# Patient Record
Sex: Male | Born: 1960 | Race: Black or African American | Hispanic: No | Marital: Single | Smoking: Never smoker
Health system: Southern US, Community
[De-identification: ages and names within clinical notes are randomized; demographics above are authoritative.]

## PROBLEM LIST (undated history)

## (undated) DIAGNOSIS — F32A Depression, unspecified: Secondary | ICD-10-CM

## (undated) DIAGNOSIS — F419 Anxiety disorder, unspecified: Secondary | ICD-10-CM

## (undated) HISTORY — PX: KNEE SURGERY: SHX244

## (undated) HISTORY — PX: KNEE ARTHROSCOPY: SUR90

## (undated) HISTORY — PX: TONSILLECTOMY: SUR1361

## (undated) HISTORY — DX: Depression, unspecified: F32.A

---

## 1997-12-11 DIAGNOSIS — G4730 Sleep apnea, unspecified: Secondary | ICD-10-CM

## 2004-10-31 DIAGNOSIS — F1120 Opioid dependence, uncomplicated: Secondary | ICD-10-CM

## 2014-09-24 ENCOUNTER — Ambulatory Visit: Payer: BC Managed Care – PPO | Admitting: Family Medicine

## 2014-09-24 ENCOUNTER — Encounter
Admission: RE | Disposition: A | Discharge status: Home / Self Care | Payer: Self-pay | Source: Ambulatory Visit | Attending: Family Medicine

## 2014-09-24 ENCOUNTER — Ambulatory Visit
Admission: RE | Admit: 2014-09-24 | Discharge: 2014-09-24 | Disposition: A | Discharge status: Home / Self Care | Payer: BC Managed Care – PPO | Source: Ambulatory Visit | Attending: Family Medicine | Admitting: Family Medicine

## 2014-09-24 DIAGNOSIS — K648 Other hemorrhoids: Secondary | ICD-10-CM | POA: Insufficient documentation

## 2014-09-24 DIAGNOSIS — Z1211 Encounter for screening for malignant neoplasm of colon: Secondary | ICD-10-CM | POA: Insufficient documentation

## 2014-09-24 DIAGNOSIS — K644 Residual hemorrhoidal skin tags: Secondary | ICD-10-CM | POA: Insufficient documentation

## 2014-09-24 HISTORY — DX: Depression, unspecified: F32.A

## 2014-09-24 HISTORY — PX: COLONOSCOPY: SHX174

## 2014-09-24 SURGERY — DONT USE, USE 1094-COLONOSCOPY, DIAGNOSTIC (SCREENING)
Anesthesia: Conscious Sedation | Site: Anus | Wound class: Clean Contaminated

## 2014-09-24 MED ORDER — MIDAZOLAM HCL 2 MG/2ML IJ SOLN
INTRAMUSCULAR | Status: AC
Start: 2014-09-24 — End: ?
  Filled 2014-09-24: qty 2

## 2014-09-24 MED ORDER — MIDAZOLAM HCL 2 MG/2ML IJ SOLN
INTRAMUSCULAR | Status: AC
Start: 2014-09-24 — End: ?
  Filled 2014-09-24: qty 4

## 2014-09-24 MED ORDER — VH FENTANYL CITRATE 100 MCG/2 ML (NARRATOR)
INTRAMUSCULAR | Status: DC | PRN
Start: 2014-09-24 — End: 2014-09-24
  Administered 2014-09-24: 100 ug via INTRAVENOUS
  Administered 2014-09-24: 50 ug via INTRAVENOUS

## 2014-09-24 MED ORDER — FENTANYL CITRATE 0.05 MG/ML IJ SOLN
INTRAMUSCULAR | Status: AC
Start: 2014-09-24 — End: ?
  Filled 2014-09-24: qty 2

## 2014-09-24 MED ORDER — MIDAZOLAM HCL 2 MG/2ML IJ SOLN
INTRAMUSCULAR | Status: DC | PRN
Start: 2014-09-24 — End: 2014-09-24
  Administered 2014-09-24: 2 mg via INTRAVENOUS
  Administered 2014-09-24: 4 mg via INTRAVENOUS
  Administered 2014-09-24: 2 mg via INTRAVENOUS

## 2014-09-24 MED ORDER — LACTATED RINGERS IV SOLN
INTRAVENOUS | Status: DC
Start: 2014-09-24 — End: 2014-09-24

## 2014-09-24 SURGICAL SUPPLY — 8 items
BASKET RETRIEVAL ROTH NET STD (Supply) IMPLANT
FORCEP BIOPSY DISP. (Supply) IMPLANT
FORCEP BIOPSY HOT ALLIGATOR  D (Supply) IMPLANT
MARKER ENDOSCOPIC SPOT (Supply) IMPLANT
NEEDLE INJECTOR FORCE MAX (Supply) IMPLANT
QUICK CLIP II SGL USE ROTATABL (Supply) IMPLANT
SNARE WIRE DISP 25MM LOOP (Supply) IMPLANT
TRAP STERILE SPECIMEN 40CC (Supply) IMPLANT

## 2014-09-24 NOTE — Discharge Instructions (Signed)
[facility]  Colonoscopy Discharge Instructions    1.  Since you have had sedation, for the next 24 hours it is best to:   A.  Have someone stay with you for the next 3-4 hours.   B.  Not drink alcoholic beverages.   C.  Not make important personal or business decisions.   D.  Not drive a vehicle or operate hazardous machinery.    2.  You may experience the following:   A.  Bloating or lack of appetite.   B.  Excessive gas   C.  Some mild rectal bleeding.   D.  Constipation or diarrhea.    3.  You may limit the amount of discomfort you have by:   A.  Limiting your intake to just what you feel like eating, not what you          think you should eat.   B.  Drink at least 3 glasses of water or other liquid (not alcohol) a day         more than you usually drink.   C.  Take 1 heaping teaspoon of Metamucil or other fiber supplement every         day until stools are consistent.    4.  Please call Dr. Felisa Bonier if you observe any of the following:   A.  Excessive bleeding from the rectum.  More than 1 piece of toilet paper        or in 2 consecutive stools.   B.  Pain in the stomach that is getting worse instead of better over a        2-3 hour period.   C.  Nausea or vomiting.   D.  Increasing tenderness of the abdominal area throughout the day.   E.  Other unusual symptoms.     Dr. Felisa Bonier will send you a letter.    IF YOU THINK YOU HAVE AN EMERGENCY, PLEASE GO TO THE NEAREST EMERGENCY DEPARTMENT OR CALL 911.

## 2014-09-24 NOTE — H&P (Signed)
S)  Mr. Gill is a 53 yo male presenting today for screening colonoscopy.  No previous endo hx, no family hx of colon pathology.    O) VSS  NDNT  CTAB  RRR    A)  53 yo male for screening colonoscopy.    P)  Proceed with routine precautions.  H+P reviewed and no changes noted.

## 2014-09-24 NOTE — Op Note (Signed)
Date: 09/24/2014  PROCEDURE: Colonoscopy.    FACILITY: [facility].    PATIENT ID: The patient is a 53 year old male.  Date of birth  September 20, 1961.    COMORBIDITIES/ASA CLASSIFICATION: Depression, hypothyroidism,  arthritis, ED.  ASA classification two.    PRIOR ENDOSCOPIC HISTORY: None.    INDICATION: Colon cancer screening.  No family history of colon  cancer or colon pathology.    PROCEDURE: Indications, risks, and benefits were discussed with  the patient and all questions were answered.  Written consent  was obtained and visualized on the chart prior to beginning the  procedure.  No changes were noted.    PHYSICIAN: Olena Leatherwood, MD.    DESCRIPTION: Conscious sedation was administered initially with  4 mg Versed and pain management with 100 mcg fentanyl.  Digital  rectal exam was performed.  The sphincter tone was normal, no  masses were palpated, minimal external hemorrhoids were  visualized.  The Olympus EVIS EXERA II, serial [id number] was then  inserted into the rectum and advanced carefully to the cecum.  The crow's foot, appendiceal orifice, and ileocecal valve were  visualized.  The colonoscope was then slowly extubated and the  entire colon was carefully inspected.  The prep was moderately  good.  No lesions were seen worthy of biopsy.  The colonoscope  was retroflexed at the rectum revealing small internal  hemorrhoids.  The patient tolerated the procedure well.  Total  withdrawal time was 12 minutes.    ANESTHESIA: Conscious sedation.    PAIN MANAGEMENT: Required a total of 8 mg Versed and 150 mcg of  fentanyl.    COMPLICATIONS: None.    DISPOSITION: To home.  Given the quality of the prep, we will  recommend repeating procedure in five years.        [id number]  DD: 09/24/2014 62:13:08  DT: 09/24/2014 65:78:46  JOB: [id number]

## 2014-09-28 ENCOUNTER — Encounter: Payer: Self-pay | Admitting: Family Medicine

## 2019-02-02 ENCOUNTER — Ambulatory Visit (HOSPITAL_COMMUNITY)
Admission: EM | Admit: 2019-02-02 | Discharge: 2019-02-02 | Disposition: A | Discharge status: Home / Self Care | Payer: Self-pay | Attending: Urgent Care | Admitting: Urgent Care

## 2019-02-02 ENCOUNTER — Encounter (HOSPITAL_COMMUNITY): Payer: Self-pay | Admitting: Emergency Medicine

## 2019-02-02 DIAGNOSIS — F419 Anxiety disorder, unspecified: Secondary | ICD-10-CM

## 2019-02-02 DIAGNOSIS — G4700 Insomnia, unspecified: Secondary | ICD-10-CM

## 2019-02-02 HISTORY — DX: Anxiety disorder, unspecified: F41.9

## 2019-02-02 MED ORDER — HYDROXYZINE HCL 25 MG PO TABS: 25.0000 mg | ORAL_TABLET | Freq: Two times a day (BID) | ORAL | 0 refills | Status: DC | PRN

## 2019-02-02 NOTE — Discharge Instructions (Addendum)
[facility] [address] [place],  [place]  [address]  Main: [phone]  Fax: [phone]

## 2019-02-02 NOTE — ED Provider Notes (Signed)
  MRN: [id number] DOB: 1961/11/29  Subjective:   Jay Lawson is a 58 y.o. male presenting for 1 week history of worsening anxiety. 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 He is currently in rehab and [facility] and has been here for 1 week. Patient lives in [place], [place]. Patient has previously used Xanax with good relief. He has a PCP back home, has never had to use any kind of SSRI. He has undergone detox before ~10 years ago.   Denies taking any chronic medications.    No Known Allergies  Past Medical History:  Diagnosis Date  . Anxiety     History reviewed. No pertinent surgical history.  ROS  Objective:   Vitals: BP (!) 144/85 (BP Location: Left Arm)   Pulse 99   Temp 98.8 F (37.1 C) (Oral)   Resp 18   SpO2 96%   Physical Exam Constitutional:      Appearance: Normal appearance. He is well-developed and normal weight.  HENT:     Head: Normocephalic and atraumatic.     Right Ear: External ear normal.     Left Ear: External ear normal.     Nose: Nose normal.     Mouth/Throat:     Pharynx: Oropharynx is clear.  Eyes:     Extraocular Movements: Extraocular movements intact.     Pupils: Pupils are equal, round, and reactive to light.  Cardiovascular:     Rate and Rhythm: Normal rate.  Pulmonary:     Effort: Pulmonary effort is normal.  Neurological:     Mental Status: He is alert and oriented to person, place, and time.  Psychiatric:        Attention and Perception: Attention and perception normal.        Mood and Affect: Mood is anxious and depressed. Mood is not elated. Affect is not labile, blunt, flat, angry, tearful or inappropriate.        Speech: Speech normal.        Behavior: Behavior normal. Behavior is not agitated. Behavior is cooperative.        Thought Content: Thought content normal. Thought content is not paranoid or delusional. Thought content does not include homicidal or suicidal ideation.        Cognition  and Memory: Cognition normal.        Judgment: Judgment normal.    Assessment and Plan :   Anxiety  Insomnia, unspecified type  Counseled patient against using Xanax given that he is currently undergoing detox for opioid use.  Recommended he establish care at the [facility] wellness center.  In the meantime will use Vistaril for his insomnia. Counseled patient on potential for adverse effects with medications prescribed today, patient verbalized understanding. ER and return-to-clinic precautions discussed, patient verbalized understanding.    Wallis Bamberg, [place] 02/02/19 1423

## 2019-02-02 NOTE — ED Triage Notes (Signed)
Pt sts currently living in [facility] and having increased anxiety x 2 weeks with difficulty sleeping; pt sts has been on meds for anxiety before but not for 2 years; pt denies SI/HI

## 2019-02-07 ENCOUNTER — Ambulatory Visit (INDEPENDENT_AMBULATORY_CARE_PROVIDER_SITE_OTHER): Payer: Self-pay | Admitting: Family Medicine

## 2019-02-07 ENCOUNTER — Encounter: Payer: Self-pay | Admitting: Family Medicine

## 2019-02-07 VITALS — BP 137/80 | HR 93 | Temp 98.5°F | Resp 16 | Ht 70.0 in | Wt 223.0 lb

## 2019-02-07 DIAGNOSIS — G4700 Insomnia, unspecified: Secondary | ICD-10-CM

## 2019-02-07 DIAGNOSIS — F1911 Other psychoactive substance abuse, in remission: Secondary | ICD-10-CM

## 2019-02-07 DIAGNOSIS — Z7689 Persons encountering health services in other specified circumstances: Secondary | ICD-10-CM

## 2019-02-07 DIAGNOSIS — F39 Unspecified mood [affective] disorder: Secondary | ICD-10-CM

## 2019-02-07 DIAGNOSIS — Z23 Encounter for immunization: Secondary | ICD-10-CM

## 2019-02-07 DIAGNOSIS — Z114 Encounter for screening for human immunodeficiency virus [HIV]: Secondary | ICD-10-CM

## 2019-02-07 MED ORDER — HYDROXYZINE HCL 50 MG PO TABS: 50.0000 mg | ORAL_TABLET | Freq: Every day | ORAL | 1 refills | Status: DC

## 2019-02-07 MED ORDER — GABAPENTIN 300 MG PO CAPS: 600.0000 mg | ORAL_CAPSULE | Freq: Every day | ORAL | 1 refills | Status: DC

## 2019-02-07 NOTE — Patient Instructions (Signed)
Generalized Anxiety Disorder, Adult Generalized anxiety disorder (GAD) is a mental health disorder. People with this condition constantly worry about everyday events. Unlike normal anxiety, worry related to GAD is not triggered by a specific event. These worries also do not fade or get better with time. GAD interferes with life functions, including relationships, work, and school. GAD can vary from mild to severe. People with severe GAD can have intense waves of anxiety with physical symptoms (panic attacks). What are the causes? The exact cause of GAD is not known. What increases the risk? This condition is more likely to develop in:  Women.  People who have a family history of anxiety disorders.  People who are very shy.  People who experience very stressful life events, such as the death of a loved one.  People who have a very stressful family environment. What are the signs or symptoms? People with GAD often worry excessively about many things in their lives, such as their health and family. They may also be overly concerned about:  Doing well at work.  Being on time.  Natural disasters.  Friendships. Physical symptoms of GAD include:  Fatigue.  Muscle tension or having muscle twitches.  Trembling or feeling shaky.  Being easily startled.  Feeling like your heart is pounding or racing.  Feeling out of breath or like you cannot take a deep breath.  Having trouble falling asleep or staying asleep.  Sweating.  Nausea, diarrhea, or irritable bowel syndrome (IBS).  Headaches.  Trouble concentrating or remembering facts.  Restlessness.  Irritability. How is this diagnosed? Your health care provider can diagnose GAD based on your symptoms and medical history. You will also have a physical exam. The health care provider will ask specific questions about your symptoms, including how severe they are, when they started, and if they come and go. Your health care  provider may ask you about your use of alcohol or drugs, including prescription medicines. Your health care provider may refer you to a mental health specialist for further evaluation. Your health care provider will do a thorough examination and may perform additional tests to rule out other possible causes of your symptoms. To be diagnosed with GAD, a person must have anxiety that:  Is out of his or her control.  Affects several different aspects of his or her life, such as work and relationships.  Causes distress that makes him or her unable to take part in normal activities.  Includes at least three physical symptoms of GAD, such as restlessness, fatigue, trouble concentrating, irritability, muscle tension, or sleep problems. Before your health care provider can confirm a diagnosis of GAD, these symptoms must be present more days than they are not, and they must last for six months or longer. How is this treated? The following therapies are usually used to treat GAD:  Medicine. Antidepressant medicine is usually prescribed for long-term daily control. Antianxiety medicines may be added in severe cases, especially when panic attacks occur.  Talk therapy (psychotherapy). Certain types of talk therapy can be helpful in treating GAD by providing support, education, and guidance. Options include: ? Cognitive behavioral therapy (CBT). People learn coping skills and techniques to ease their anxiety. They learn to identify unrealistic or negative thoughts and behaviors and to replace them with positive ones. ? Acceptance and commitment therapy (ACT). This treatment teaches people how to be mindful as a way to cope with unwanted thoughts and feelings. ? Biofeedback. This process trains you to manage your body's response (  physiological response) through breathing techniques and relaxation methods. You will work with a therapist while machines are used to monitor your physical symptoms.  Stress  management techniques. These include yoga, meditation, and exercise. A mental health specialist can help determine which treatment is best for you. Some people see improvement with one type of therapy. However, other people require a combination of therapies. Follow these instructions at home:  Take over-the-counter and prescription medicines only as told by your health care provider.  Try to maintain a normal routine.  Try to anticipate stressful situations and allow extra time to manage them.  Practice any stress management or self-calming techniques as taught by your health care provider.  Do not punish yourself for setbacks or for not making progress.  Try to recognize your accomplishments, even if they are small.  Keep all follow-up visits as told by your health care provider. This is important. Contact a health care provider if:  Your symptoms do not get better.  Your symptoms get worse.  You have signs of depression, such as: ? A persistently sad, cranky, or irritable mood. ? Loss of enjoyment in activities that used to bring you joy. ? Change in weight or eating. ? Changes in sleeping habits. ? Avoiding friends or family members. ? Loss of energy for normal tasks. ? Feelings of guilt or worthlessness. Get help right away if:  You have serious thoughts about hurting yourself or others. If you ever feel like you may hurt yourself or others, or have thoughts about taking your own life, get help right away. You can go to your nearest emergency department or call:  Your local emergency services (911 in the [place]  A suicide crisis helpline, such as the National Suicide Prevention Lifeline at 4454089317. This is open 24 hours a day. Summary  Generalized anxiety disorder (GAD) is a mental health disorder that involves worry that is not triggered by a specific event.  People with GAD often worry excessively about many things in their lives, such as their health and  family.  GAD may cause physical symptoms such as restlessness, trouble concentrating, sleep problems, frequent sweating, nausea, diarrhea, headaches, and trembling or muscle twitching.  A mental health specialist can help determine which treatment is best for you. Some people see improvement with one type of therapy. However, other people require a combination of therapies. This information is not intended to replace advice given to you by your health care provider. Make sure you discuss any questions you have with your health care provider. Document Released: 03/24/2013 Document Revised: 10/17/2016 Document Reviewed: 10/17/2016 Elsevier Interactive Patient Education  2019 [employer]  Hydroxyzine capsules or tablets What is this medicine? HYDROXYZINE (hye DROX i zeen) is an antihistamine. This medicine is used to treat allergy symptoms. It is also used to treat anxiety and tension. This medicine can be used with other medicines to induce sleep before surgery. This medicine may be used for other purposes; ask your health care provider or pharmacist if you have questions. COMMON BRAND NAME(S): ANX, Atarax, Rezine, Vistaril What should I tell my health care provider before I take this medicine? They need to know if you have any of these conditions: -glaucoma -heart disease -history of irregular heartbeat -kidney disease -liver disease -lung or breathing disease, like asthma -stomach or intestine problems -thyroid disease -trouble passing urine -an unusual or allergic reaction to hydroxyzine, cetirizine, other medicines, foods, dyes or preservatives -pregnant or trying to get pregnant -breast-feeding How should I use  this medicine? Take this medicine by mouth with a full glass of water. Follow the directions on the prescription label. You may take this medicine with food or on an empty stomach. Take your medicine at regular intervals. Do not take your medicine more often than  directed. Talk to your pediatrician regarding the use of this medicine in children. Special care may be needed. While this drug may be prescribed for children as young as 79 years of age for selected conditions, precautions do apply. Patients over 28 years old may have a stronger reaction and need a smaller dose. Overdosage: If you think you have taken too much of this medicine contact a poison control center or emergency room at once. NOTE: This medicine is only for you. Do not share this medicine with others. What if I miss a dose? If you miss a dose, take it as soon as you can. If it is almost time for your next dose, take only that dose. Do not take double or extra doses. What may interact with this medicine? Do not take this medicine with any of the following medications: -cisapride -dofetilide -dronedarone -pimozide -thioridazine This medicine may also interact with the following medications: -alcohol -antihistamines for allergy, cough, and cold -atropine -barbiturate medicines for sleep or seizures, like phenobarbital -certain antibiotics like erythromycin or clarithromycin -certain medicines for anxiety or sleep -certain medicines for bladder problems like oxybutynin, tolterodine -certain medicines for depression or psychotic disturbances -certain medicines for irregular heart beat -certain medicines for Parkinson's disease like benztropine, trihexyphenidyl -certain medicines for seizures like phenobarbital, primidone -certain medicines for stomach problems like dicyclomine, hyoscyamine -certain medicines for travel sickness like scopolamine -ipratropium -narcotic medicines for pain -other medicines that prolong the QT interval (an abnormal heart rhythm) This list may not describe all possible interactions. Give your health care provider a list of all the medicines, herbs, non-prescription drugs, or dietary supplements you use. Also tell them if you smoke, drink alcohol, or use  illegal drugs. Some items may interact with your medicine. What should I watch for while using this medicine? Tell your doctor or health care professional if your symptoms do not improve. You may get drowsy or dizzy. Do not drive, use machinery, or do anything that needs mental alertness until you know how this medicine affects you. Do not stand or sit up quickly, especially if you are an older patient. This reduces the risk of dizzy or fainting spells. Alcohol may interfere with the effect of this medicine. Avoid alcoholic drinks. Your mouth may get dry. Chewing sugarless gum or sucking hard candy, and drinking plenty of water may help. Contact your doctor if the problem does not go away or is severe. This medicine may cause dry eyes and blurred vision. If you wear contact lenses you may feel some discomfort. Lubricating drops may help. See your eye doctor if the problem does not go away or is severe. If you are receiving skin tests for allergies, tell your doctor you are using this medicine. What side effects may I notice from receiving this medicine? Side effects that you should report to your doctor or health care professional as soon as possible: -allergic reactions like skin rash, itching or hives, swelling of the face, lips, or tongue -changes in vision -confusion -fast, irregular heartbeat -seizures -tremor -trouble passing urine or change in the amount of urine Side effects that usually do not require medical attention (report to your doctor or health care professional if they continue or are bothersome): -  constipation -drowsiness -dry mouth -headache -tiredness This list may not describe all possible side effects. Call your doctor for medical advice about side effects. You may report side effects to FDA at 1-800-FDA-1088. Where should I keep my medicine? Keep out of the reach of children. Store at room temperature between 15 and 30 degrees C (59 and 86 degrees F). Keep container  tightly closed. Throw away any unused medicine after the expiration date. NOTE: This sheet is a summary. It may not cover all possible information. If you have questions about this medicine, talk to your doctor, pharmacist, or health care provider.  2019 Elsevier/Gold Standard (2018-06-10 13:25:13) Gabapentin capsules or tablets What is this medicine? GABAPENTIN (GA ba pen tin) is used to control seizures in certain types of epilepsy. It is also used to treat certain types of nerve pain. This medicine may be used for other purposes; ask your health care provider or pharmacist if you have questions. COMMON BRAND NAME(S): Active-PAC with Gabapentin, Gabarone, Neurontin What should I tell my health care provider before I take this medicine? They need to know if you have any of these conditions: -kidney disease -suicidal thoughts, plans, or attempt; a previous suicide attempt by you or a family member -an unusual or allergic reaction to gabapentin, other medicines, foods, dyes, or preservatives -pregnant or trying to get pregnant -breast-feeding How should I use this medicine? Take this medicine by mouth with a glass of water. Follow the directions on the prescription label. You can take it with or without food. If it upsets your stomach, take it with food. Take your medicine at regular intervals. Do not take it more often than directed. Do not stop taking except on your doctor's advice. If you are directed to break the 600 or 800 mg tablets in half as part of your dose, the extra half tablet should be used for the next dose. If you have not used the extra half tablet within 28 days, it should be thrown away. A special MedGuide will be given to you by the pharmacist with each prescription and refill. Be sure to read this information carefully each time. Talk to your pediatrician regarding the use of this medicine in children. While this drug may be prescribed for children as young as 3 years for  selected conditions, precautions do apply. Overdosage: If you think you have taken too much of this medicine contact a poison control center or emergency room at once. NOTE: This medicine is only for you. Do not share this medicine with others. What if I miss a dose? If you miss a dose, take it as soon as you can. If it is almost time for your next dose, take only that dose. Do not take double or extra doses. What may interact with this medicine? Do not take this medicine with any of the following medications: -other gabapentin products This medicine may also interact with the following medications: -alcohol -antacids -antihistamines for allergy, cough and cold -certain medicines for anxiety or sleep -certain medicines for depression or psychotic disturbances -homatropine; hydrocodone -naproxen -narcotic medicines (opiates) for pain -phenothiazines like chlorpromazine, mesoridazine, prochlorperazine, thioridazine This list may not describe all possible interactions. Give your health care provider a list of all the medicines, herbs, non-prescription drugs, or dietary supplements you use. Also tell them if you smoke, drink alcohol, or use illegal drugs. Some items may interact with your medicine. What should I watch for while using this medicine? Visit your doctor or health care professional for  regular checks on your progress. You may want to keep a record at home of how you feel your condition is responding to treatment. You may want to share this information with your doctor or health care professional at each visit. You should contact your doctor or health care professional if your seizures get worse or if you have any new types of seizures. Do not stop taking this medicine or any of your seizure medicines unless instructed by your doctor or health care professional. Stopping your medicine suddenly can increase your seizures or their severity. Wear a medical identification bracelet or chain if  you are taking this medicine for seizures, and carry a card that lists all your medications. You may get drowsy, dizzy, or have blurred vision. Do not drive, use machinery, or do anything that needs mental alertness until you know how this medicine affects you. To reduce dizzy or fainting spells, do not sit or stand up quickly, especially if you are an older patient. Alcohol can increase drowsiness and dizziness. Avoid alcoholic drinks. Your mouth may get dry. Chewing sugarless gum or sucking hard candy, and drinking plenty of water will help. The use of this medicine may increase the chance of suicidal thoughts or actions. Pay special attention to how you are responding while on this medicine. Any worsening of mood, or thoughts of suicide or dying should be reported to your health care professional right away. Women who become pregnant while using this medicine may enroll in the [place] [employer] Antiepileptic Drug Pregnancy Registry by calling [phone]. This registry collects information about the safety of antiepileptic drug use during pregnancy. What side effects may I notice from receiving this medicine? Side effects that you should report to your doctor or health care professional as soon as possible: -allergic reactions like skin rash, itching or hives, swelling of the face, lips, or tongue -worsening of mood, thoughts or actions of suicide or dying Side effects that usually do not require medical attention (report to your doctor or health care professional if they continue or are bothersome): -constipation -difficulty walking or controlling muscle movements -dizziness -nausea -slurred speech -tiredness -tremors -weight gain This list may not describe all possible side effects. Call your doctor for medical advice about side effects. You may report side effects to FDA at 1-800-FDA-1088. Where should I keep my medicine? Keep out of reach of children. This medicine may cause accidental  overdose and death if it taken by other adults, children, or pets. Mix any unused medicine with a substance like cat litter or coffee grounds. Then throw the medicine away in a sealed container like a sealed bag or a coffee can with a lid. Do not use the medicine after the expiration date. Store at room temperature between 15 and 30 degrees C (59 and 86 degrees F). NOTE: This sheet is a summary. It may not cover all possible information. If you have questions about this medicine, talk to your doctor, pharmacist, or health care provider.  2019 Elsevier/Gold Standard (2018-05-02 13:21:44)

## 2019-02-07 NOTE — Progress Notes (Addendum)
[facility]  New Patient Encounter Provider: Mike Gip, FNP    ZSW:[id number]  DUK:[id number]  DOB - 1960-12-27  SUBJECTIVE:   Jay Lawson, is a 57 y.o. male who presents to establish care with this clinic.   Current problems/concerns:   Patient states that he was diagnosed with anxiety several years ago while in a treatment program in [place], [place]. He states that he was given xanax for several years by his PCP for this. He states that he was on trazodone and Seroquel in the past. He stopped them due to making him feel weird and the combination made him sleep walk. Patient was given hydroxyzine in the ED and states that it did not help with anxiety or sleep. He states that it helped him to stop shaking. "It's like I have a neon sign in my head that is shorting out. It's like white noise or something".  Patient states that he has not slept more than an hour at a time for the past few weeks. During the day, he is falling asleep and having difficulty with concentrating. Patient states that he has had suicidal thoughts in the past. Today,  he denies suicidal ideations, intent or plan. He states that he is ok as long as he is in [employer], but he states that he does not have anything to look forward to when he goes back to [place], [place]. Patient reports having 3 children, parents and a brother who live in [place]. Patient reports living alone in [place]. Patient reports graduating high school and took a few CAD classes at a local community college. He states that he was working in [employer] prior to coming to [employer]. 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 Began to wean himself off at home 3 weeks ago and  has not had any substances in the past 2 weeks.  No Known Allergies Past Medical History:  Diagnosis Date  . Anxiety      No current outpatient medications on file prior to visit.   No current facility-administered medications on file prior to visit.    Family History  Problem Relation Age of Onset  . Stroke Maternal Aunt   . Cancer Maternal Grandfather   . Heart disease Paternal Grandmother    Social History   Socioeconomic History  . Marital status: Single    Spouse name: Not on file  . Number of children: Not on file  . Years of education: Not on file  . Highest education level: Not on file  Occupational History  . Not on file  Social Needs  . Financial resource strain: Not on file  . Food insecurity:    Worry: Not on file    Inability: Not on file  . Transportation needs:    Medical: Not on file    Non-medical: Not on file  Tobacco Use  . Smoking status: Never Smoker  . Smokeless tobacco: Never Used  Substance and Sexual Activity  . Alcohol use: Not Currently    Frequency: Never  . Drug use: Not Currently  . Sexual activity: Not on file  Lifestyle  . Physical activity:    Days per week: Not on file    Minutes per session: Not on file  . Stress: Not on file  Relationships  . Social connections:    Talks on phone: Not on file  Gets together: Not on file    Attends religious service: Not on file    Active member of club or organization: Not on file    Attends meetings of clubs or organizations: Not on file    Relationship status: Not on file  . Intimate partner violence:    Fear of current or ex partner: Not on file    Emotionally abused: Not on file    Physically abused: Not on file    Forced sexual activity: Not on file  Other Topics Concern  . Not on file  Social History Narrative  . Not on file    Review of Systems  Constitutional: Negative.   HENT: Negative.   Eyes: Negative.   Respiratory: Negative.   Cardiovascular: Negative.   Gastrointestinal: Negative.   Genitourinary: Negative.   Musculoskeletal: Negative.   Skin: Negative.   Neurological: Negative.    Psychiatric/Behavioral: Positive for depression. Negative for suicidal ideas. The patient is nervous/anxious and has insomnia.      OBJECTIVE:    BP 137/80 (BP Location: Right Arm, Patient Position: Sitting, Cuff Size: Normal)   Pulse 93   Temp 98.5 F (36.9 C) (Oral)   Resp 16   Ht 5\' 10"  (1.778 m)   Wt 223 lb (101.2 kg)   SpO2 100%   BMI 32.00 kg/m   Physical Exam  Constitutional: He is oriented to person, place, and time and well-developed, well-nourished, and in no distress. No distress.  HENT:  Head: Normocephalic and atraumatic.  Eyes: Pupils are equal, round, and reactive to light. Conjunctivae and EOM are normal.  Neck: Normal range of motion. Neck supple.  Cardiovascular: Normal rate, regular rhythm and intact distal pulses. Exam reveals no gallop and no friction rub.  No murmur heard. Pulmonary/Chest: Effort normal and breath sounds normal. No respiratory distress. He has no wheezes.  Abdominal: Soft. Bowel sounds are normal. There is no abdominal tenderness.  Musculoskeletal: Normal range of motion.        General: No tenderness or edema.  Lymphadenopathy:    He has no cervical adenopathy.  Neurological: He is alert and oriented to person, place, and time. Gait normal.  Skin: Skin is warm and dry.  Psychiatric: Mood, memory, affect and judgment normal.  Nursing note and vitals reviewed.    ASSESSMENT/PLAN:  1. Insomnia, unspecified type Patient in a new environment and having difficulty. Will start with gabapentin and vistaril. He is unsure if he has had an SSRI in the past.  - gabapentin (NEURONTIN) 300 MG capsule; Take 2 capsules (600 mg total) by mouth at bedtime. Take 300mg  po at 8 pm  x 1 week then increase to 600mg  at 8 pm  Dispense: 60 capsule; Refill: 1 - hydrOXYzine (ATARAX/VISTARIL) 50 MG tablet; Take 1-2 tablets (50-100 mg total) by mouth at bedtime.  Dispense: 60 tablet; Refill: 1  Mood disorder (HCC) - gabapentin (NEURONTIN) 300 MG capsule; Take  2 capsules (600 mg total) by mouth at bedtime. 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  The patient was given clear instructions to go to ER or return to medical center if symptoms don't improve, worsen or  new problems develop. The patient verbalized understanding. The patient was told to call to get lab results if they haven't heard anything in the next week.     This note has been created with Education officer, environmentalDragon speech recognition software and smart phrase technology. Any transcriptional errors are unintentional.   Ms. Andr L. Riley Lamouglas, FNP-BC [facility] [address]  [place], [place] [id number] [phone]

## 2019-02-08 LAB — COMPREHENSIVE METABOLIC PANEL
ALT: 27 IU/L (ref 0–44)
AST: 16 IU/L (ref 0–40)
Albumin/Globulin Ratio: 2 (ref 1.2–2.2)
Albumin: 4.7 g/dL (ref 3.8–4.9)
Alkaline Phosphatase: 94 IU/L (ref 39–117)
BUN/Creatinine Ratio: 9 (ref 9–20)
BUN: 9 mg/dL (ref 6–24)
Bilirubin Total: 0.5 mg/dL (ref 0.0–1.2)
CO2: 23 mmol/L (ref 20–29)
Calcium: 10.1 mg/dL (ref 8.7–10.2)
Chloride: 105 mmol/L (ref 96–106)
Creatinine, Ser: 0.95 mg/dL (ref 0.76–1.27)
GFR calc Af Amer: 104 mL/min/{1.73_m2} (ref >59)
GFR calc non Af Amer: 90 mL/min/{1.73_m2} (ref >59)
Globulin, Total: 2.4 g/dL (ref 1.5–4.5)
Glucose: 110 mg/dL — ABNORMAL HIGH (ref 65–99)
Potassium: 4.4 mmol/L (ref 3.5–5.2)
Sodium: 146 mmol/L — ABNORMAL HIGH (ref 134–144)
Total Protein: 7.1 g/dL (ref 6.0–8.5)

## 2019-02-08 LAB — TSH: TSH: 1.07 u[IU]/mL (ref 0.450–4.500)

## 2019-02-08 LAB — CBC WITH DIFFERENTIAL/PLATELET
Basophils Absolute: 0.1 10*3/uL (ref 0.0–0.2)
Basos: 1 %
EOS (ABSOLUTE): 0.2 10*3/uL (ref 0.0–0.4)
Eos: 2 %
Hematocrit: 41.5 % (ref 37.5–51.0)
Hemoglobin: 14.3 g/dL (ref 13.0–17.7)
Immature Grans (Abs): 0.1 10*3/uL (ref 0.0–0.1)
Immature Granulocytes: 1 %
Lymphocytes Absolute: 2.5 10*3/uL (ref 0.7–3.1)
Lymphs: 23 %
MCH: 30 pg (ref 26.6–33.0)
MCHC: 34.5 g/dL (ref 31.5–35.7)
MCV: 87 fL (ref 79–97)
Monocytes Absolute: 0.8 10*3/uL (ref 0.1–0.9)
Monocytes: 7 %
Neutrophils Absolute: 7.4 10*3/uL — ABNORMAL HIGH (ref 1.4–7.0)
Neutrophils: 66 %
Platelets: 343 10*3/uL (ref 150–450)
RBC: 4.77 x10E6/uL (ref 4.14–5.80)
RDW: 15.3 % (ref 11.6–15.4)
WBC: 11 10*3/uL — ABNORMAL HIGH (ref 3.4–10.8)

## 2019-02-13 ENCOUNTER — Encounter: Payer: Self-pay | Admitting: Family Medicine

## 2019-02-20 DIAGNOSIS — N6310 Unspecified lump in the right breast, unspecified quadrant: Secondary | ICD-10-CM | POA: Insufficient documentation

## 2019-02-20 DIAGNOSIS — J309 Allergic rhinitis, unspecified: Secondary | ICD-10-CM | POA: Insufficient documentation

## 2019-02-21 ENCOUNTER — Other Ambulatory Visit: Payer: Self-pay

## 2019-02-21 ENCOUNTER — Encounter: Payer: Self-pay | Admitting: Family Medicine

## 2019-02-21 ENCOUNTER — Ambulatory Visit (INDEPENDENT_AMBULATORY_CARE_PROVIDER_SITE_OTHER): Payer: Self-pay | Admitting: Family Medicine

## 2019-02-21 VITALS — BP 122/80 | HR 101 | Ht 70.0 in | Wt 229.0 lb

## 2019-02-21 DIAGNOSIS — G4700 Insomnia, unspecified: Secondary | ICD-10-CM

## 2019-02-21 DIAGNOSIS — G2581 Restless legs syndrome: Secondary | ICD-10-CM

## 2019-02-21 MED ORDER — GABAPENTIN 600 MG PO TABS: 1200.0000 mg | ORAL_TABLET | Freq: Every day | ORAL | 0 refills | Status: DC

## 2019-02-21 NOTE — Patient Instructions (Signed)
I am increasing the gabapentin to 1200 mg. Take this 2 hours prior to bedtime to help with restless legs.   Insomnia Insomnia is a sleep disorder that makes it difficult to fall asleep or stay asleep. Insomnia can cause fatigue, low energy, difficulty concentrating, mood swings, and poor performance at work or school. There are three different ways to classify insomnia:  Difficulty falling asleep.  Difficulty staying asleep.  Waking up too early in the morning. Any type of insomnia can be long-term (chronic) or short-term (acute). Both are common. Short-term insomnia usually lasts for three months or less. Chronic insomnia occurs at least three times a week for longer than three months. What are the causes? Insomnia may be caused by another condition, situation, or substance, such as:  Anxiety.  Certain medicines.  Gastroesophageal reflux disease (GERD) or other gastrointestinal conditions.  Asthma or other breathing conditions.  Restless legs syndrome, sleep apnea, or other sleep disorders.  Chronic pain.  Menopause.  Stroke.  Abuse of alcohol, tobacco, or illegal drugs.  Mental health conditions, such as depression.  Caffeine.  Neurological disorders, such as Alzheimer's disease.  An overactive thyroid (hyperthyroidism). Sometimes, the cause of insomnia may not be known. What increases the risk? Risk factors for insomnia include:  Gender. Women are affected more often than men.  Age. Insomnia is more common as you get older.  Stress.  Lack of exercise.  Irregular work schedule or working night shifts.  Traveling between different time zones.  Certain medical and mental health conditions. What are the signs or symptoms? If you have insomnia, the main symptom is having trouble falling asleep or having trouble staying asleep. This may lead to other symptoms, such as:  Feeling fatigued or having low energy.  Feeling nervous about going to sleep.  Not  feeling rested in the morning.  Having trouble concentrating.  Feeling irritable, anxious, or depressed. How is this diagnosed? This condition may be diagnosed based on:  Your symptoms and medical history. Your health care provider may ask about: ? Your sleep habits. ? Any medical conditions you have. ? Your mental health.  A physical exam. How is this treated? Treatment for insomnia depends on the cause. Treatment may focus on treating an underlying condition that is causing insomnia. Treatment may also include:  Medicines to help you sleep.  Counseling or therapy.  Lifestyle adjustments to help you sleep better. Follow these instructions at home: Eating and drinking   Limit or avoid alcohol, caffeinated beverages, and cigarettes, especially close to bedtime. These can disrupt your sleep.  Do not eat a large meal or eat spicy foods right before bedtime. This can lead to digestive discomfort that can make it hard for you to sleep. Sleep habits   Keep a sleep diary to help you and your health care provider figure out what could be causing your insomnia. Write down: ? When you sleep. ? When you wake up during the night. ? How well you sleep. ? How rested you feel the next day. ? Any side effects of medicines you are taking. ? What you eat and drink.  Make your bedroom a dark, comfortable place where it is easy to fall asleep. ? Put up shades or blackout curtains to block light from outside. ? Use a white noise machine to block noise. ? Keep the temperature cool.  Limit screen use before bedtime. This includes: ? Watching TV. ? Using your smartphone, tablet, or computer.  Stick to a routine that includes  going to bed and waking up at the same times every day and night. This can help you fall asleep faster. Consider making a quiet activity, such as reading, part of your nighttime routine.  Try to avoid taking naps during the day so that you sleep better at night.  Get  out of bed if you are still awake after 15 minutes of trying to sleep. Keep the lights down, but try reading or doing a quiet activity. When you feel sleepy, go back to bed. General instructions  Take over-the-counter and prescription medicines only as told by your health care provider.  Exercise regularly, as told by your health care provider. Avoid exercise starting several hours before bedtime.  Use relaxation techniques to manage stress. Ask your health care provider to suggest some techniques that may work well for you. These may include: ? Breathing exercises. ? Routines to release muscle tension. ? Visualizing peaceful scenes.  Make sure that you drive carefully. Avoid driving if you feel very sleepy.  Keep all follow-up visits as told by your health care provider. This is important. Contact a health care provider if:  You are tired throughout the day.  You have trouble in your daily routine due to sleepiness.  You continue to have sleep problems, or your sleep problems get worse. Get help right away if:  You have serious thoughts about hurting yourself or someone else. If you ever feel like you may hurt yourself or others, or have thoughts about taking your own life, get help right away. You can go to your nearest emergency department or call:  Your local emergency services (911 in the [place]  A suicide crisis helpline, such as the National Suicide Prevention Lifeline at 808-835-1351. This is open 24 hours a day. Summary  Insomnia is a sleep disorder that makes it difficult to fall asleep or stay asleep.  Insomnia can be long-term (chronic) or short-term (acute).  Treatment for insomnia depends on the cause. Treatment may focus on treating an underlying condition that is causing insomnia.  Keep a sleep diary to help you and your health care provider figure out what could be causing your insomnia. This information is not intended to replace advice given to you by your  health care provider. Make sure you discuss any questions you have with your health care provider. Document Released: 11/24/2000 Document Revised: 09/06/2017 Document Reviewed: 09/06/2017 Elsevier Interactive Patient Education  2019 Elsevier Inc.  Restless Legs Syndrome Restless legs syndrome is a condition that causes uncomfortable feelings or sensations in the legs, especially while sitting or lying down. The sensations usually cause an overwhelming urge to move the legs. The arms can also sometimes be affected. The condition can range from mild to severe. The symptoms often interfere with a person's ability to sleep. What are the causes? The cause of this condition is not known. What increases the risk? The following factors may make you more likely to develop this condition:  Being older than 50.  Pregnancy.  Being a woman. In general, the condition is more common in women than in men.  A family history of the condition.  Having iron deficiency.  Overuse of caffeine, nicotine, or alcohol.  Certain medical conditions, such as kidney disease, Parkinson's disease, or nerve damage.  Certain medicines, such as those for high blood pressure, nausea, colds, allergies, depression, and some heart conditions. What are the signs or symptoms? The main symptom of this condition is uncomfortable sensations in the legs, such as:  Pulling.  Tingling.  Prickling.  Throbbing.  Crawling.  Burning. Usually, the sensations:  Affect both sides of the body.  Are worse when you sit or lie down.  Are worse at night. These may wake you up or make it difficult to fall asleep.  Make you have a strong urge to move your legs.  Are temporarily relieved by moving your legs. The arms can also be affected, but this is rare. People who have this condition often have tiredness during the day because of their lack of sleep at night. How is this diagnosed? This condition may be diagnosed based  on:  Your symptoms.  Blood tests. In some cases, you may be monitored in a sleep lab by a specialist (a sleep study). This can detect any disruptions in your sleep. How is this treated? This condition is treated by managing the symptoms. This may include:  Lifestyle changes, such as exercising, using relaxation techniques, and avoiding caffeine, alcohol, or tobacco.  Medicines. Anti-seizure medicines may be tried first. Follow these instructions at home:     General instructions  Take over-the-counter and prescription medicines only as told by your health care provider.  Use methods to help relieve the uncomfortable sensations, such as: ? Massaging your legs. ? Walking or stretching. ? Taking a cold or hot bath.  Keep all follow-up visits as told by your health care provider. This is important. Lifestyle  Practice good sleep habits. For example, go to bed and get up at the same time every day. Most adults should get 7-9 hours of sleep each night.  Exercise regularly. Try to get at least 30 minutes of exercise most days of the week.  Practice ways of relaxing, such as yoga or meditation.  Avoid caffeine and alcohol.  Do not use any products that contain nicotine or tobacco, such as cigarettes and e-cigarettes. If you need help quitting, ask your health care provider. Contact a health care provider if:  Your symptoms get worse or they do not improve with treatment. Summary  Restless legs syndrome is a condition that causes uncomfortable feelings or sensations in the legs, especially while sitting or lying down.  The symptoms often interfere with a person's ability to sleep.  This condition is treated by managing the symptoms. You may need to make lifestyle changes or take medicines. This information is not intended to replace advice given to you by your health care provider. Make sure you discuss any questions you have with your health care provider. Document Released:  11/17/2002 Document Revised: 12/17/2017 Document Reviewed: 12/17/2017 Elsevier Interactive Patient Education  2019 [employer]

## 2019-02-21 NOTE — Progress Notes (Signed)
[facility]   Progress Note: General Provider: Mike Gip, FNP  SUBJECTIVE:   Jay Lawson is a 58 y.o. male who  has a past medical history of Anxiety.. Patient presents today for Insomnia   Patient states that he was seen by [facility] and was given Zyprexa 5 mg QHS  and buspar 5 mg TID. Patient states that he is still not able to sleep. He states that his "body is wore out" and has to work through out the day at [employer].  He states that he has restlessness of his legs at night.  Patient states that he has been on seroquel and trazodone in the past with mild relief. Total relief with taking xanax.   Review of Systems  Constitutional: Negative.   HENT: Negative.   Eyes: Negative.   Respiratory: Negative.   Cardiovascular: Negative.   Gastrointestinal: Negative.   Genitourinary: Negative.   Musculoskeletal: Negative.   Skin: Negative.   Neurological: Negative.   Psychiatric/Behavioral: The patient is nervous/anxious and has insomnia.      OBJECTIVE: BP 122/80   Pulse (!) 101   Ht 5\' 10"  (1.778 m)   Wt 229 lb (103.9 kg)   SpO2 99%   BMI 32.86 kg/m   Wt Readings from Last 3 Encounters:  02/21/19 229 lb (103.9 kg)  02/07/19 223 lb (101.2 kg)     Physical Exam Vitals signs and nursing note reviewed.  Constitutional:      General: He is not in acute distress.    Appearance: He is well-developed.  HENT:     Head: Normocephalic and atraumatic.  Eyes:     Conjunctiva/sclera: Conjunctivae normal.     Pupils: Pupils are equal, round, and reactive to light.  Neck:     Musculoskeletal: Normal range of motion.  Cardiovascular:     Rate and Rhythm: Normal rate and regular rhythm.     Heart sounds: Normal heart sounds.  Pulmonary:     Effort: Pulmonary effort is normal. No respiratory distress.     Breath sounds: Normal breath sounds.  Musculoskeletal: Normal range of motion.  Skin:    General: Skin is warm and  dry.  Neurological:     Mental Status: He is alert and oriented to person, place, and time.  Psychiatric:        Attention and Perception: Attention normal.        Mood and Affect: Affect normal. Mood is anxious.        Speech: Speech normal.        Behavior: Behavior normal. Behavior is cooperative.        Thought Content: Thought content normal.        Cognition and Memory: Cognition normal.        Judgment: Judgment normal.     ASSESSMENT/PLAN:  1. Insomnia, unspecified type Patient is followed by [facility]. Instructed patient to continue with medications and to inform provider that medication is not effective for sleep. We discussed that after substance abuse, he may have difficulty with sleep due to being without abused substance. This can last for several months after cessation of use.  - gabapentin (NEURONTIN) 600 MG tablet; Take 2 tablets (1,200 mg total) by mouth at bedtime.  Dispense: 60 tablet; Refill: 0  2. Restless leg Increased gabapentin to help with restless legs at night.  - gabapentin (NEURONTIN) 600 MG tablet; Take 2 tablets (1,200 mg total) by mouth at bedtime.  Dispense: 60 tablet;  Refill: 0    Return in about 4 weeks (around 03/21/2019) for insomnia.   Time Spent: 25 minutes face-to-face with this patient discussing problems, treatments, and answering patient's questions.   The patient was given clear instructions to go to ER or return to medical center if symptoms do not improve, worsen or new problems develop. The patient verbalized understanding and agreed with plan of care.   Ms. Freda Jackson. Riley Lam, FNP-BC [facility] [address] [place], [place] [address] [phone]

## 2019-03-11 ENCOUNTER — Other Ambulatory Visit: Payer: Self-pay | Admitting: Family Medicine

## 2019-03-11 DIAGNOSIS — G4700 Insomnia, unspecified: Secondary | ICD-10-CM

## 2019-03-11 DIAGNOSIS — G2581 Restless legs syndrome: Secondary | ICD-10-CM

## 2019-03-17 ENCOUNTER — Other Ambulatory Visit: Payer: Self-pay | Admitting: Family Medicine

## 2019-03-17 DIAGNOSIS — G2581 Restless legs syndrome: Secondary | ICD-10-CM

## 2019-03-17 DIAGNOSIS — G4700 Insomnia, unspecified: Secondary | ICD-10-CM

## 2019-03-17 NOTE — Telephone Encounter (Signed)
Will you call this patient and see if the medication is being taken as prescribed. He is asking for refills too early. Also, please make sure that he is following up with [facility].

## 2019-03-19 NOTE — Telephone Encounter (Signed)
I see the 3/13 but it was just for 1 month

## 2019-03-19 NOTE — Telephone Encounter (Signed)
Did I send this to you already? 

## 2019-03-26 ENCOUNTER — Ambulatory Visit: Payer: Self-pay | Admitting: Family Medicine

## 2019-04-05 ENCOUNTER — Other Ambulatory Visit: Payer: Self-pay

## 2019-04-05 ENCOUNTER — Encounter (HOSPITAL_COMMUNITY): Payer: Self-pay | Admitting: Emergency Medicine

## 2019-04-05 ENCOUNTER — Emergency Department (HOSPITAL_COMMUNITY)
Admission: EM | Admit: 2019-04-05 | Discharge: 2019-04-05 | Disposition: A | Discharge status: Home / Self Care | Payer: Self-pay | Attending: Emergency Medicine | Admitting: Emergency Medicine

## 2019-04-05 DIAGNOSIS — Z79899 Other long term (current) drug therapy: Secondary | ICD-10-CM | POA: Insufficient documentation

## 2019-04-05 DIAGNOSIS — B9789 Other viral agents as the cause of diseases classified elsewhere: Secondary | ICD-10-CM

## 2019-04-05 DIAGNOSIS — J069 Acute upper respiratory infection, unspecified: Secondary | ICD-10-CM | POA: Insufficient documentation

## 2019-04-05 NOTE — ED Notes (Signed)
Patient verbalizes understanding of discharge instructions. Opportunity for questioning and answers were provided. Armband removed by staff, pt discharged from ED ambulatory to home.  

## 2019-04-05 NOTE — ED Provider Notes (Signed)
[facility] Note   CSN: [id number] Arrival date & time: 04/05/19  1809    History   Chief Complaint No chief complaint on file.   HPI Jay Lawson is a 58 y.o. male.     Patient indicates is at [facility] and that a couple residents there have recent cough/fever, and are in quarantine - and pt indicates as he has non productive cough/fever, they sent him here to get checked. Patient notes for past couple days low grade fever, episodic/intermittent non productive cough. No sore throat. Mild nasal congestion. No abd pain or nvd. No headache. No neck pain/stiffness. Denies chest pain or discomfort. No sob. No specific known positive covid contact.   The history is provided by the patient.    Past Medical History:  Diagnosis Date  . Anxiety     Patient Active Problem List   Diagnosis Date Noted  . Allergic rhinitis 02/20/2019  . Breast mass, right 02/20/2019  . 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 Opioid type dependence, abuse (HCC) 10/31/2004  . Sleep apnea 12/11/1997    Past Surgical History:  Procedure Laterality Date  . KNEE SURGERY     right knee         Home Medications    Prior to Admission medications   Medication Sig Start Date End Date Taking? Authorizing Provider  busPIRone (BUSPAR) 5 MG tablet Take 5 mg by mouth 3 (three) times daily.    [provider]  gabapentin (NEURONTIN) 600 MG tablet TAKE 2 TABLETS (1,200 MG TOTAL) BY MOUTH AT BEDTIME. 03/20/19   Mike Gipouglas, Andre, FNP  hydrOXYzine (ATARAX/VISTARIL) 50 MG tablet Take 1-2 tablets (50-100 mg total) by mouth at bedtime. 02/07/19   Mike Gipouglas, Andre, FNP  OLANZapine (ZYPREXA) 5 MG tablet Take 5 mg by mouth at bedtime.    [provider]    Family History Family History  Problem Relation Age of Onset  . Stroke Maternal Aunt   . Cancer Maternal Grandfather   . Heart disease Paternal Grandmother     Social History Social  History   Tobacco Use  . Smoking status: Never Smoker  . Smokeless tobacco: Never Used  Substance Use Topics  . Alcohol use: Not Currently    Frequency: Never  . Drug use: Not Currently     Allergies   Patient has no known allergies.   Review of Systems Review of Systems  Constitutional: Positive for fever.  HENT: Positive for congestion. Negative for sore throat.   Eyes: Negative for redness.  Respiratory: Positive for cough. Negative for shortness of breath.   Cardiovascular: Negative for chest pain.  Gastrointestinal: Negative for abdominal pain and diarrhea.  Genitourinary: Negative for dysuria and flank pain.  Musculoskeletal: Negative for neck pain and neck stiffness.  Skin: Negative for rash.  Neurological: Negative for headaches.  Hematological: Does not bruise/bleed easily.  Psychiatric/Behavioral: Negative for confusion.     Physical Exam Updated Vital Signs BP (!) 158/86 (BP Location: Right Arm)   Pulse (!) 102   Temp 100.2 F (37.9 C) (Oral)   Resp 18   Ht 1.778 m (5\' 10" )   Wt 104.3 kg   SpO2 95%   BMI 33.00 kg/m   Physical Exam Vitals signs and nursing note reviewed.  Constitutional:      Appearance: Normal appearance. He is well-developed.  HENT:     Head: Atraumatic.     Nose:     Comments: Mild nasal congestion.  Mouth/Throat:     Mouth: Mucous membranes are moist.     Pharynx: Oropharynx is clear.  Eyes:     General: No scleral icterus.    Conjunctiva/sclera: Conjunctivae normal.  Neck:     Musculoskeletal: Normal range of motion and neck supple. No neck rigidity.     Trachea: No tracheal deviation.  Cardiovascular:     Rate and Rhythm: Normal rate and regular rhythm.     Pulses: Normal pulses.     Heart sounds: Normal heart sounds. No murmur. No friction rub. No gallop.   Pulmonary:     Effort: Pulmonary effort is normal. No accessory muscle usage or respiratory distress.     Breath sounds: Normal breath sounds.  Abdominal:      General: There is no distension.     Palpations: Abdomen is soft.     Tenderness: There is no abdominal tenderness.  Genitourinary:    Comments: No cva tenderness. Musculoskeletal:        General: No swelling.  Skin:    General: Skin is warm and dry.     Findings: No rash.  Neurological:     Mental Status: He is alert.     Comments: Alert, speech clear.   Psychiatric:        Mood and Affect: Mood normal.      ED Treatments / Results  Labs (all labs ordered are listed, but only abnormal results are displayed) Labs Reviewed - No data to display  EKG None  Radiology No results found.  Procedures Procedures (including critical care time)  Medications Ordered in ED Medications - No data to display   Initial Impression / Assessment and Plan / ED Course  I have reviewed the triage vital signs and the nursing notes.  Pertinent labs & imaging results that were available during my care of the patient were reviewed by me and considered in my medical decision making (see chart for details).  Patient presents reporting that a 2-3 others in his facility have recent cough/fever and are quarantined.   Patient w low grade fever, and occasional non prod cough. Patient is breathing comfortably, rr 16, sats 95-99%, with no increased work of breathing.   Discussed w pt that he may have covid. Patient is breathing very comfortably, sat currently 98%. Discussed quarantine, social distancing.   Return precautions provided.   Pt currently appears stable for d/c.     Final Clinical Impressions(s) / ED Diagnoses   Final diagnoses:  None    ED Discharge Orders    None       Cathren Laine, MD 04/05/19 [phone]

## 2019-04-05 NOTE — Discharge Instructions (Addendum)
It was our pleasure to provide your ER care today - we hope that you feel better.  Your symptoms are consistent with a viral upper respiratory illness, which could be covid-19.   Self-quarantine for atleast 7 days since onset of your fever/cough, AND atleast 3 days after fever and cough resolved.   Your blood pressure is elevated - follow up with primary care doctor in the next couple weeks.   Return to ER if worse, new symptoms, increased trouble breathing, other concern.

## 2019-04-05 NOTE — ED Triage Notes (Signed)
Pt from group home. Pt has cough and fever. One roommate tested positive for COVID. Here for evaluation

## 2019-04-13 ENCOUNTER — Emergency Department (HOSPITAL_COMMUNITY): Payer: HRSA Program

## 2019-04-13 ENCOUNTER — Encounter (HOSPITAL_COMMUNITY): Payer: Self-pay

## 2019-04-13 ENCOUNTER — Other Ambulatory Visit: Payer: Self-pay

## 2019-04-13 ENCOUNTER — Inpatient Hospital Stay (HOSPITAL_COMMUNITY)
Admission: EM | Admit: 2019-04-13 | Discharge: 2019-04-16 | DRG: 177 | Disposition: A | Discharge status: Home / Self Care | Payer: HRSA Program | Source: Other Acute Inpatient Hospital | Attending: Internal Medicine | Admitting: Internal Medicine

## 2019-04-13 DIAGNOSIS — A4189 Other specified sepsis: Secondary | ICD-10-CM | POA: Diagnosis present

## 2019-04-13 DIAGNOSIS — Z823 Family history of stroke: Secondary | ICD-10-CM

## 2019-04-13 DIAGNOSIS — U071 COVID-19: Principal | ICD-10-CM | POA: Diagnosis present

## 2019-04-13 DIAGNOSIS — R1031 Right lower quadrant pain: Secondary | ICD-10-CM | POA: Diagnosis present

## 2019-04-13 DIAGNOSIS — Z79899 Other long term (current) drug therapy: Secondary | ICD-10-CM

## 2019-04-13 DIAGNOSIS — R109 Unspecified abdominal pain: Secondary | ICD-10-CM

## 2019-04-13 DIAGNOSIS — R112 Nausea with vomiting, unspecified: Secondary | ICD-10-CM

## 2019-04-13 DIAGNOSIS — J1289 Other viral pneumonia: Secondary | ICD-10-CM | POA: Diagnosis present

## 2019-04-13 DIAGNOSIS — A419 Sepsis, unspecified organism: Secondary | ICD-10-CM

## 2019-04-13 DIAGNOSIS — E876 Hypokalemia: Secondary | ICD-10-CM | POA: Diagnosis not present

## 2019-04-13 DIAGNOSIS — R740 Nonspecific elevation of levels of transaminase and lactic acid dehydrogenase [LDH]: Secondary | ICD-10-CM | POA: Diagnosis present

## 2019-04-13 DIAGNOSIS — R7401 Elevation of levels of liver transaminase levels: Secondary | ICD-10-CM

## 2019-04-13 DIAGNOSIS — Z8249 Family history of ischemic heart disease and other diseases of the circulatory system: Secondary | ICD-10-CM

## 2019-04-13 DIAGNOSIS — F1911 Other psychoactive substance abuse, in remission: Secondary | ICD-10-CM | POA: Diagnosis not present

## 2019-04-13 DIAGNOSIS — G4730 Sleep apnea, unspecified: Secondary | ICD-10-CM | POA: Diagnosis not present

## 2019-04-13 DIAGNOSIS — F1120 Opioid dependence, uncomplicated: Secondary | ICD-10-CM | POA: Diagnosis present

## 2019-04-13 LAB — COMPREHENSIVE METABOLIC PANEL
ALT: 58 U/L — ABNORMAL HIGH (ref 0–44)
AST: 47 U/L — ABNORMAL HIGH (ref 15–41)
Albumin: 3.7 g/dL (ref 3.5–5.0)
Alkaline Phosphatase: 107 U/L (ref 38–126)
Anion gap: 10 (ref 5–15)
BUN: 11 mg/dL (ref 6–20)
CO2: 28 mmol/L (ref 22–32)
Calcium: 8.6 mg/dL — ABNORMAL LOW (ref 8.9–10.3)
Chloride: 99 mmol/L (ref 98–111)
Creatinine, Ser: 1.19 mg/dL (ref 0.61–1.24)
GFR calc Af Amer: >60 mL/min (ref >60)
GFR calc non Af Amer: >60 mL/min (ref >60)
Glucose, Bld: 175 mg/dL — ABNORMAL HIGH (ref 70–99)
Potassium: 3 mmol/L — ABNORMAL LOW (ref 3.5–5.1)
Sodium: 137 mmol/L (ref 135–145)
Total Bilirubin: 0.9 mg/dL (ref 0.3–1.2)
Total Protein: 7.5 g/dL (ref 6.5–8.1)

## 2019-04-13 LAB — CBC WITH DIFFERENTIAL/PLATELET
Abs Immature Granulocytes: 0.11 10*3/uL — ABNORMAL HIGH (ref 0.00–0.07)
Basophils Absolute: 0 10*3/uL (ref 0.0–0.1)
Basophils Relative: 0 %
Eosinophils Absolute: 0 10*3/uL (ref 0.0–0.5)
Eosinophils Relative: 0 %
HCT: 40.5 % (ref 39.0–52.0)
Hemoglobin: 13.3 g/dL (ref 13.0–17.0)
Immature Granulocytes: 1 %
Lymphocytes Relative: 9 %
Lymphs Abs: 0.9 10*3/uL (ref 0.7–4.0)
MCH: 28.8 pg (ref 26.0–34.0)
MCHC: 32.8 g/dL (ref 30.0–36.0)
MCV: 87.7 fL (ref 80.0–100.0)
Monocytes Absolute: 0.3 10*3/uL (ref 0.1–1.0)
Monocytes Relative: 3 %
Neutro Abs: 8.5 10*3/uL — ABNORMAL HIGH (ref 1.7–7.7)
Neutrophils Relative %: 87 %
Platelets: 355 10*3/uL (ref 150–400)
RBC: 4.62 MIL/uL (ref 4.22–5.81)
RDW: 12.9 % (ref 11.5–15.5)
WBC: 9.8 10*3/uL (ref 4.0–10.5)
nRBC: 0 % (ref 0.0–0.2)

## 2019-04-13 LAB — LACTIC ACID, PLASMA: Lactic Acid, Venous: 1.3 mmol/L (ref 0.5–1.9)

## 2019-04-13 LAB — SARS CORONAVIRUS 2 BY RT PCR (HOSPITAL ORDER, PERFORMED IN ~~LOC~~ HOSPITAL LAB): SARS Coronavirus 2: POSITIVE — AB

## 2019-04-13 MED ORDER — SODIUM CHLORIDE 0.9 % IV BOLUS (SEPSIS)
1000.0000 mL | Freq: Once | INTRAVENOUS | Status: AC
  Administered 2019-04-13: 1000 mL via INTRAVENOUS

## 2019-04-13 MED ORDER — ACETAMINOPHEN 500 MG PO TABS
1000.0000 mg | ORAL_TABLET | Freq: Once | ORAL | Status: AC
  Administered 2019-04-13: 1000 mg via ORAL
  Filled 2019-04-13: qty 2

## 2019-04-13 MED ORDER — METOCLOPRAMIDE HCL 5 MG/ML IJ SOLN
10.0000 mg | Freq: Once | INTRAMUSCULAR | Status: AC
  Administered 2019-04-13: 10 mg via INTRAMUSCULAR
  Filled 2019-04-13: qty 2

## 2019-04-13 MED ORDER — SODIUM CHLORIDE 0.9 % IV SOLN
2.0000 g | Freq: Once | INTRAVENOUS | Status: AC
  Administered 2019-04-13: 2 g via INTRAVENOUS
  Filled 2019-04-13: qty 2

## 2019-04-13 MED ORDER — SODIUM CHLORIDE 0.9 % IV BOLUS (SEPSIS)
500.0000 mL | Freq: Once | INTRAVENOUS | Status: AC
  Administered 2019-04-13: 500 mL via INTRAVENOUS

## 2019-04-13 MED ORDER — SODIUM CHLORIDE (PF) 0.9 % IJ SOLN
INTRAMUSCULAR | Status: AC
  Filled 2019-04-13: qty 50

## 2019-04-13 MED ORDER — POTASSIUM CHLORIDE 20 MEQ PO PACK
40.0000 meq | PACK | Freq: Once | ORAL | Status: AC
  Administered 2019-04-13: 40 meq via ORAL
  Filled 2019-04-13: qty 2

## 2019-04-13 MED ORDER — ACETAMINOPHEN 650 MG RE SUPP: 650.0000 mg | Freq: Once | RECTAL | Status: DC

## 2019-04-13 MED ORDER — METRONIDAZOLE IN NACL 5-0.79 MG/ML-% IV SOLN
500.0000 mg | Freq: Once | INTRAVENOUS | Status: AC
  Administered 2019-04-13: 500 mg via INTRAVENOUS
  Filled 2019-04-13: qty 100

## 2019-04-13 MED ORDER — IOHEXOL 300 MG/ML  SOLN
100.0000 mL | Freq: Once | INTRAMUSCULAR | Status: AC | PRN
  Administered 2019-04-13: 100 mL via INTRAVENOUS

## 2019-04-13 MED ORDER — VANCOMYCIN HCL IN DEXTROSE 1-5 GM/200ML-% IV SOLN
1000.0000 mg | Freq: Once | INTRAVENOUS | Status: AC
  Administered 2019-04-13: 1000 mg via INTRAVENOUS
  Filled 2019-04-13: qty 200

## 2019-04-13 NOTE — ED Notes (Signed)
ED TO INPATIENT HANDOFF REPORT  ED Nurse Name and Phone #: [place] G, [phone]  S Name/Age/Gender Benson Norwayerence Steiner 58 y.o. male Room/Bed: WA18/WA18  Code Status   Code Status: Not on file  Home/SNF/Other Home Patient oriented to: self, place, time and situation Is this baseline? Yes   Triage Complete: Triage complete  Chief Complaint fever;vomiting  Triage Note  He tells us he has had fevers and occasional vomiting x 4 days. He also c/o anorexia. He is in no distress.    Allergies No Known Allergies  Level of Care/Admitting Diagnosis ED Disposition    ED Disposition Condition Comment   Admit  [facility] [[id number]]  Level of Care: Progressive [102]  Covid Evaluation: Confirmed COVID Positive  Isolation Risk Level: Low Risk/Droplet (Less than 4L [place] supplementation)  Diagnosis: Real time reverse transcriptase PCR positive for COVID-19 virus [[id number]][[phone]]  Admitting Physician: Frankey ShownADEFESO, OLADAPO [[id number]]  Attending Physician: Frankey ShownADEFESO, OLADAPO [[id number]]  Estimated length of stay: past midnight tomorrow  Certification:: I certify this patient will need inpatient services for at least 2 midnights  PT Class (Do Not Modify): Inpatient [101]  PT Acc Code (Do Not Modify): Private [1]       B Medical/Surgery History Past Medical History:  Diagnosis Date  . Anxiety    Past Surgical History:  Procedure Laterality Date  . KNEE SURGERY     right knee      A IV Location/Drains/Wounds Patient Lines/Drains/Airways Status   Active Line/Drains/Airways    Name:   Placement date:   Placement time:   Site:   Days:   Peripheral IV 04/13/19 Right Hand   04/13/19    1820    Hand   less than 1          Intake/Output Last 24 hours  Intake/Output Summary (Last 24 hours) at 04/13/2019 2327 Last data filed at 04/13/2019 2248 Gross per 24 hour  Intake 3400 ml  Output -  Net 3400 ml    Labs/Imaging Results for orders placed or performed during the  hospital encounter of 04/13/19 (from the past 48 hour(s))  SARS Coronavirus 2 (CEPHEID- Performed in [facility] lab), Hosp Order     Status: Abnormal   Collection Time: 04/13/19  5:52 PM  Result Value Ref Range   SARS Coronavirus 2 POSITIVE (A) NEGATIVE    Comment: RESULT CALLED TO, READ BACK BY AND VERIFIED WITH: Gustave Lindeman,G @ 2145 ON [id number] BY POTEAT,S (NOTE) If result is NEGATIVE SARS-CoV-2 target nucleic acids are NOT DETECTED. The SARS-CoV-2 RNA is generally detectable in upper and lower  respiratory specimens during the acute phase of infection. The lowest  concentration of SARS-CoV-2 viral copies this assay can detect is 250  copies / mL. A negative result does not preclude SARS-CoV-2 infection  and should not be used as the sole basis for treatment or other  patient management decisions.  A negative result may occur with  improper specimen collection / handling, submission of specimen other  than nasopharyngeal swab, presence of viral mutation(s) within the  areas targeted by this assay, and inadequate number of viral copies  (<250 copies / mL). A negative result must be combined with clinical  observations, patient history, and epidemiological information. If result is POSITIVE SARS-CoV-2 target nucleic acids are DETECTED . The SARS-CoV-2 RNA is generally detectable in upper and lower  respiratory specimens during the acute phase of infection.  Positive  results are indicative of active infection with SARS-CoV-2.  Clinical  correlation with patient history and other diagnostic information is  necessary to determine patient infection status.  Positive results do  not rule out bacterial infection or co-infection with other viruses. If result is PRESUMPTIVE POSTIVE SARS-CoV-2 nucleic acids MAY BE PRESENT.   A presumptive positive result was obtained on the submitted specimen  and confirmed on repeat testing.  While 2019 novel coronavirus  (SARS-CoV-2) nucleic acids  may be present in the submitted sample  additional confirmatory testing may be necessary for epidemiological  and / or clinical management purposes  to differentiate between  SARS-CoV-2 and other Sarbecovirus currently known to infect humans.  If clinically indicated additional testing with an alternate test  methodology [phone] ) is advised. The SARS-CoV-2 RNA is generally  detectable in upper and lower respiratory specimens during the acute  phase of infection. The expected result is Negative. Fact Sheet for Patients:  BoilerBrush.com.cy Fact Sheet for Healthcare Providers: https://pope.com/ This test is not yet approved or cleared by the [place] FDA and has been authorized for detection and/or diagnosis of SARS-CoV-2 by FDA under an Emergency Use Authorization (EUA).  This EUA will remain in effect (meaning this test can be used) for the duration of the COVID-19 declaration under Section 564(b)(1) of the Act, 21 U.S.C. section 360bbb-3(b)(1), unless the authorization is terminated or revoked sooner. Performed at [facility], [address], [place], [place] [address]   Lactic acid, plasma     Status: None   Collection Time: 04/13/19  5:56 PM  Result Value Ref Range   Lactic Acid, Venous 1.3 0.5 - 1.9 mmol/L    Comment: Performed at [facility], [address], [place], [place] [address]  Comprehensive metabolic panel     Status: Abnormal   Collection Time: 04/13/19  6:28 PM  Result Value Ref Range   Sodium 137 135 - 145 mmol/L   Potassium 3.0 (L) 3.5 - 5.1 mmol/L   Chloride 99 98 - 111 mmol/L   CO2 28 22 - 32 mmol/L   Glucose, Bld 175 (H) 70 - 99 mg/dL   BUN 11 6 - 20 mg/dL   Creatinine, Ser 4.78 0.61 - 1.24 mg/dL   Calcium 8.6 (L) 8.9 - 10.3 mg/dL   Total Protein 7.5 6.5 - 8.1 g/dL   Albumin 3.7 3.5 - 5.0 g/dL   AST 47 (H) 15 - 41 U/L   ALT 58 (H) 0 - 44 U/L   Alkaline  Phosphatase 107 38 - 126 U/L   Total Bilirubin 0.9 0.3 - 1.2 mg/dL   GFR calc non Af Amer >60 >60 mL/min   GFR calc Af Amer >60 >60 mL/min   Anion gap 10 5 - 15    Comment: Performed at [facility], [address], [place], [place] [address]  CBC WITH DIFFERENTIAL     Status: Abnormal   Collection Time: 04/13/19  6:28 PM  Result Value Ref Range   WBC 9.8 4.0 - 10.5 K/uL   RBC 4.62 4.22 - 5.81 MIL/uL   Hemoglobin 13.3 13.0 - 17.0 g/dL   HCT 13.0 86.5 - 78.4 %   MCV 87.7 80.0 - 100.0 fL   MCH 28.8 26.0 - 34.0 pg   MCHC 32.8 30.0 - 36.0 g/dL   RDW 69.6 29.5 - 28.4 %   Platelets 355 150 - 400 K/uL   nRBC 0.0 0.0 - 0.2 %   Neutrophils Relative % 87 %   Neutro Abs 8.5 (  H) 1.7 - 7.7 K/uL   Lymphocytes Relative 9 %   Lymphs Abs 0.9 0.7 - 4.0 K/uL   Monocytes Relative 3 %   Monocytes Absolute 0.3 0.1 - 1.0 K/uL   Eosinophils Relative 0 %   Eosinophils Absolute 0.0 0.0 - 0.5 K/uL   Basophils Relative 0 %   Basophils Absolute 0.0 0.0 - 0.1 K/uL   WBC Morphology MILD LEFT SHIFT (1-5% METAS, OCC MYELO, OCC BANDS)    Immature Granulocytes 1 %   Abs Immature Granulocytes 0.11 (H) 0.00 - 0.07 K/uL    Comment: Performed at [facility], [address], [place], [place] [address]   Ct Abdomen Pelvis W Contrast  Result Date: 04/13/2019 CLINICAL DATA:  58 y/o M; right lower quadrant abdominal pain, fever, cough. EXAM: CT ABDOMEN AND PELVIS WITH CONTRAST TECHNIQUE: Multidetector CT imaging of the abdomen and pelvis was performed using the standard protocol following bolus administration of intravenous contrast. CONTRAST:  OMNIPAQUE IOHEXOL 300 MG/ML  SOLN COMPARISON:  04/13/2019 chest radiograph FINDINGS: Lower chest: Patchy ground-glass opacities within the lung bases and peripheral and bronchovascular distribution. Left lower lobe calcified granuloma. Hepatobiliary: No focal liver abnormality is seen. No gallstones, gallbladder wall thickening, or biliary  dilatation. Pancreas: Unremarkable. No pancreatic ductal dilatation or surrounding inflammatory changes. Spleen: Scattered calcified granulomas. Lobulated contour. Adrenals/Urinary Tract: Adrenal glands are unremarkable. Kidneys are normal, without renal calculi, focal lesion, or hydronephrosis. Bladder is unremarkable. Stomach/Bowel: Stomach is within normal limits. No evidence of bowel wall thickening, distention, or inflammatory changes. Appendix is borderline in diameter measuring 6 mm, no secondary signs of acute appendicitis. Vascular/Lymphatic: No significant vascular findings are present. No enlarged abdominal or pelvic lymph nodes. Reproductive: Prostate is unremarkable. Other: Left inguinal hernia containing fat. Musculoskeletal: No fracture is seen. Degenerative changes of the lumbar spine with prominent facet arthropathy. IMPRESSION: 1. There are a spectrum of findings in the lungs which can be seen with acute atypical infection (as well as other non-infectious etiologies). In particular, viral pneumonia (including COVID-19) should be considered in the appropriate clinical setting. 2. No acute process of abdomen or pelvis identified. Appendix is borderline in diameter, however, there are no secondary signs of acute appendicitis and this is likely a normal variant. Critical Value/emergent results were called by telephone at the time of interpretation on 04/13/2019 at 8:40 pm to Dr. Arthor Captain , who verbally acknowledged these results. Electronically Signed   By: Mitzi Hansen M.D.   On: 04/13/2019 20:41   Dg Chest Port 1 View  Result Date: 04/13/2019 CLINICAL DATA:  He tells [place] he has had fevers and occasional vomiting x 4 days. Nonsmoker. EXAM: PORTABLE CHEST 1 VIEW COMPARISON:  None. FINDINGS: Bilateral patchy areas of airspace disease with interstitial thickening. No pleural effusion or pneumothorax. Stable cardiomediastinal silhouette. No aggressive osseous lesion. IMPRESSION:  Bilateral interstitial and patchy alveolar airspace disease concerning for multilobar pneumonia versus atypical or viral pneumonia. Electronically Signed   By: Elige Ko   On: 04/13/2019 18:41    Pending Labs Unresulted Labs (From admission, onward)    Start     Ordered   04/13/19 1733  Blood Culture (routine x 2)  BLOOD CULTURE X 2,   STAT     04/13/19 1734   04/13/19 1733  Urinalysis, Routine w reflex microscopic  ONCE - STAT,   STAT     04/13/19 1734          Vitals/Pain Today's Vitals   04/13/19 2100  04/13/19 2153 04/13/19 2200 04/13/19 2315  BP: 106/63  118/66 118/69  Pulse: 85  78 79  Resp: (!) 22  20 20   Temp:  98.9 F (37.2 C)    TempSrc:  Oral    SpO2: 90%  95% 96%  PainSc:  0-No pain      Isolation Precautions Droplet and Contact precautions  Medications Medications  sodium chloride 0.9 % bolus 1,000 mL (0 mLs Intravenous Stopped 04/13/19 2247)    And  sodium chloride 0.9 % bolus 1,000 mL (0 mLs Intravenous Stopped 04/13/19 2247)    And  sodium chloride 0.9 % bolus 1,000 mL (0 mLs Intravenous Stopped 04/13/19 2247)    And  sodium chloride 0.9 % bolus 500 mL (has no administration in time range)  sodium chloride (PF) 0.9 % injection (has no administration in time range)  ceFEPIme (MAXIPIME) 2 g in sodium chloride 0.9 % 100 mL IVPB (0 g Intravenous Stopped 04/13/19 1930)  metroNIDAZOLE (FLAGYL) IVPB 500 mg (0 mg Intravenous Stopped 04/13/19 2248)  vancomycin (VANCOCIN) IVPB 1000 mg/200 mL premix (0 mg Intravenous Stopped 04/13/19 2248)  acetaminophen (TYLENOL) tablet 1,000 mg (1,000 mg Oral Given 04/13/19 1847)  metoCLOPramide (REGLAN) injection 10 mg (10 mg Intramuscular Given 04/13/19 1847)  iohexol (OMNIPAQUE) 300 MG/ML solution 100 mL (100 mLs Intravenous Contrast Given 04/13/19 2021)    Mobility walks Low fall risk

## 2019-04-13 NOTE — ED Provider Notes (Signed)
[facility]-EMERGENCY DEPT Provider Note   CSN: [id number] Arrival date & time: 04/13/19  1711    History   Chief Complaint Chief Complaint  Patient presents with   Abdominal Pain   Fever    HPI Jay Lawson is a 58 y.o. male who presents the department chief complaint of fever and abdominal pain.  The patient is currently living in a rehab facility, [facility] house.  Jay Lawson is in a group setting.  Jay Lawson was here on 04/05/2019 with complaint of fever URI and concern for potential coronavirus but was not tested.  4 days ago the patient began having right lower quadrant pain.  3 days ago Jay Lawson began having intermittent fevers.  Jay Lawson has had loss of appetite and has not eaten in the past 48 hours.  Today Jay Lawson spent most of the day vomiting and has arrived here with a fever of 103.1 F.  The patient states that his pain is worse when Jay Lawson moves better when Jay Lawson lies still.  Jay Lawson denies urinary symptoms, flank pain, rashes, cough or other symptoms of URI.  Patient placed on contact precautions due to high fever.     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 Authorizing Provider  acetaminophen (TYLENOL) 500 MG tablet Take 500 mg by mouth every 6 (six) hours as needed for mild pain, moderate pain or headache.   Yes [provider]  gabapentin (NEURONTIN) 600 MG tablet TAKE 2 TABLETS (1,200 MG TOTAL) BY MOUTH AT BEDTIME. Patient taking differently: Take 600 mg by mouth at bedtime.  03/20/19  Yes Mike Gip, FNP  hydrOXYzine  (ATARAX/VISTARIL) 50 MG tablet Take 1-2 tablets (50-100 mg total) by mouth at bedtime. Patient taking differently: Take 50 mg by mouth at bedtime.  02/07/19  Yes Mike Gip, FNP  OLANZapine (ZYPREXA) 5 MG tablet Take 5 mg by mouth at bedtime.   Yes [provider]    Family History Family History  Problem Relation Age of Onset   Stroke Maternal Aunt    Cancer Maternal Grandfather    Heart disease Paternal Grandmother     Social History Social History   Tobacco Use   Smoking status: Never Smoker   Smokeless tobacco: Never Used  Substance Use Topics   Alcohol use: Not Currently    Frequency: Never   Drug use: Not Currently     Allergies   Patient has no known allergies.   Review of Systems Review of Systems Ten systems reviewed and are negative for acute change, except as noted in the HPI.    Physical Exam Updated Vital Signs BP 118/66 (BP Location: Right Arm)    Pulse 78    Temp 98.9 F (37.2 C) (Oral)    Resp 20    SpO2 95%   Physical Exam Vitals signs and nursing note reviewed.  Constitutional:      General: Jay Lawson is not in acute distress.    Appearance: Jay Lawson is  well-developed. Jay Lawson is not diaphoretic.  HENT:     Head: Normocephalic and atraumatic.  Eyes:     General: No scleral icterus.    Conjunctiva/sclera: Conjunctivae normal.  Neck:     Musculoskeletal: Normal range of motion and neck supple.  Cardiovascular:     Rate and Rhythm: Normal rate and regular rhythm.     Heart sounds: Normal heart sounds.  Pulmonary:     Effort: Pulmonary effort is normal. No respiratory distress.     Breath sounds: Normal breath sounds.  Abdominal:     General: Abdomen is protuberant. There is no distension.     Palpations: Abdomen is soft.     Tenderness: There is abdominal tenderness in the right lower quadrant. There is guarding.    Skin:    General: Skin is warm and dry.  Neurological:     Mental Status: Jay Lawson is alert.  Psychiatric:        Behavior:  Behavior normal.      ED Treatments / Results  Labs (all labs ordered are listed, but only abnormal results are displayed) Labs Reviewed  SARS CORONAVIRUS 2 (HOSPITAL ORDER, PERFORMED IN [facility]) - Abnormal; Notable for the following components:      Result Value   SARS Coronavirus 2 POSITIVE (*)    All other components within normal limits  COMPREHENSIVE METABOLIC PANEL - Abnormal; Notable for the following components:   Potassium 3.0 (*)    Glucose, Bld 175 (*)    Calcium 8.6 (*)    AST 47 (*)    ALT 58 (*)    All other components within normal limits  CBC WITH DIFFERENTIAL/PLATELET - Abnormal; Notable for the following components:   Neutro Abs 8.5 (*)    Abs Immature Granulocytes 0.11 (*)    All other components within normal limits  CULTURE, BLOOD (ROUTINE X 2)  CULTURE, BLOOD (ROUTINE X 2)  LACTIC ACID, PLASMA  LACTIC ACID, PLASMA  URINALYSIS, ROUTINE W REFLEX MICROSCOPIC    EKG None  Radiology Ct Abdomen Pelvis W Contrast  Result Date: 04/13/2019 CLINICAL DATA:  58 y/o M; right lower quadrant abdominal pain, fever, cough. EXAM: CT ABDOMEN AND PELVIS WITH CONTRAST TECHNIQUE: Multidetector CT imaging of the abdomen and pelvis was performed using the standard protocol following bolus administration of intravenous contrast. CONTRAST:  OMNIPAQUE IOHEXOL 300 MG/ML  SOLN COMPARISON:  04/13/2019 chest radiograph FINDINGS: Lower chest: Patchy ground-glass opacities within the lung bases and peripheral and bronchovascular distribution. Left lower lobe calcified granuloma. Hepatobiliary: No focal liver abnormality is seen. No gallstones, gallbladder wall thickening, or biliary dilatation. Pancreas: Unremarkable. No pancreatic ductal dilatation or surrounding inflammatory changes. Spleen: Scattered calcified granulomas. Lobulated contour. Adrenals/Urinary Tract: Adrenal glands are unremarkable. Kidneys are normal, without renal calculi, focal lesion, or  hydronephrosis. Bladder is unremarkable. Stomach/Bowel: Stomach is within normal limits. No evidence of bowel wall thickening, distention, or inflammatory changes. Appendix is borderline in diameter measuring 6 mm, no secondary signs of acute appendicitis. Vascular/Lymphatic: No significant vascular findings are present. No enlarged abdominal or pelvic lymph nodes. Reproductive: Prostate is unremarkable. Other: Left inguinal hernia containing fat. Musculoskeletal: No fracture is seen. Degenerative changes of the lumbar spine with prominent facet arthropathy. IMPRESSION: 1. There are a spectrum of findings in the lungs which can be seen with acute atypical infection (as well as other non-infectious etiologies). In particular, viral pneumonia (including COVID-19) should be considered in the appropriate clinical setting. 2. No acute process of abdomen or  pelvis identified. Appendix is borderline in diameter, however, there are no secondary signs of acute appendicitis and this is likely a normal variant. Critical Value/emergent results were called by telephone at the time of interpretation on 04/13/2019 at 8:40 pm to Dr. Arthor Captain , who verbally acknowledged these results. Electronically Signed   By: Mitzi Hansen M.D.   On: 04/13/2019 20:41   Dg Chest Port 1 View  Result Date: 04/13/2019 CLINICAL DATA:  Jay Lawson tells [place] Jay Lawson has had fevers and occasional vomiting x 4 days. Nonsmoker. EXAM: PORTABLE CHEST 1 VIEW COMPARISON:  None. FINDINGS: Bilateral patchy areas of airspace disease with interstitial thickening. No pleural effusion or pneumothorax. Stable cardiomediastinal silhouette. No aggressive osseous lesion. IMPRESSION: Bilateral interstitial and patchy alveolar airspace disease concerning for multilobar pneumonia versus atypical or viral pneumonia. Electronically Signed   By: Elige Ko   On: 04/13/2019 18:41    Procedures Procedures (including critical care time)  Medications Ordered in  ED Medications  sodium chloride 0.9 % bolus 1,000 mL (0 mLs Intravenous Stopped 04/13/19 2247)    And  sodium chloride 0.9 % bolus 1,000 mL (0 mLs Intravenous Stopped 04/13/19 2247)    And  sodium chloride 0.9 % bolus 1,000 mL (0 mLs Intravenous Stopped 04/13/19 2247)    And  sodium chloride 0.9 % bolus 500 mL (has no administration in time range)  acetaminophen (TYLENOL) suppository 650 mg (has no administration in time range)  sodium chloride (PF) 0.9 % injection (has no administration in time range)  ceFEPIme (MAXIPIME) 2 g in sodium chloride 0.9 % 100 mL IVPB (0 g Intravenous Stopped 04/13/19 1930)  metroNIDAZOLE (FLAGYL) IVPB 500 mg (0 mg Intravenous Stopped 04/13/19 2248)  vancomycin (VANCOCIN) IVPB 1000 mg/200 mL premix (0 mg Intravenous Stopped 04/13/19 2248)  acetaminophen (TYLENOL) tablet 1,000 mg (1,000 mg Oral Given 04/13/19 1847)  metoCLOPramide (REGLAN) injection 10 mg (10 mg Intramuscular Given 04/13/19 1847)  iohexol (OMNIPAQUE) 300 MG/ML solution 100 mL (100 mLs Intravenous Contrast Given 04/13/19 2021)     Initial Impression / Assessment and Plan / ED Course  I have reviewed the triage vital signs and the nursing notes.  Pertinent labs & imaging results that were available during my care of the patient were reviewed by me and considered in my medical decision making (see chart for details).  Clinical Course as of Apr 12 2301  Wynelle Link Apr 13, 2019  2038 I spoke with Dr. Marland Kitchen Patient without abnormal findings in the abdomen, However, Lungs concerning for COVID-19   [AH]  2145 Patient Covid-19 test positive.  Oxygen saturation is in the low 90s.  Will call for admission   [AH]  2146 SpO2: 90 % [AH]    Clinical Course User Index [AH] Arthor Captain, PA-C       Kawon Culverhouse was evaluated in Emergency Department on 04/13/2019 for the symptoms described in the history of present illness. Jay Lawson was evaluated in the context of the global COVID-19 pandemic, which necessitated consideration  that the patient might be at risk for infection with the SARS-CoV-2 virus that causes COVID-19. Institutional protocols and algorithms that pertain to the evaluation of patients at risk for COVID-19 are in a state of rapid change based on information released by regulatory bodies including the CDC and federal and state organizations. These policies and algorithms were followed during the patient's care in the ED.    Final Clinical Impressions(s) / ED Diagnoses   Final diagnoses:  COVID-19 virus infection  Patient with  coronavirus.  His oxygen saturations have been dropping down to 90% here.  Jay Lawson has mild hypokalemia.  Slightly elevated AST and ALT.  Not have leukocytosis.  Patient's SARS coronavirus to panel positive.  Recently reviewed both the chest x-ray and a CT abdomen and pelvis.  No intra-abdominal abnormality is noted however there is obvious glass opacity and multi lobar pneumonia present in the bilateral lungs.  Patient will be admitted for coronavirus.  Jay Lawson is appropriate for admission at this time.  ED Discharge Orders    None       Arthor CaptainHarris, Elimelech Houseman, PA-C 04/13/19 2304    Lorre NickAllen, Anthony, MD 04/16/19 [phone]

## 2019-04-13 NOTE — ED Notes (Signed)
[facility] called for transport to [facility] 

## 2019-04-13 NOTE — ED Triage Notes (Signed)
He tells [place] he has had fevers and occasional vomiting x 4 days. He also c/o anorexia. He is in no distress.

## 2019-04-13 NOTE — ED Notes (Signed)
Bed: BW38 Expected date:  Expected time:  Means of arrival:  Comments: EMS- 59yo M, Fever, GI

## 2019-04-13 NOTE — ED Notes (Signed)
Upon entering the room, patient's IV in his R forearm was found to be infiltrated. This line was running the vancomycin. IV removed immediately, ice applied and patient educated to elevate arm. Pharmacy was called and said that it could cause some irritation to tissue and advised to elevate and put on a compress.

## 2019-04-13 NOTE — H&P (Signed)
History and Physical    Jay Lawson ZOX:[id number] DOB: [birth year] DOA: 04/13/2019  Referring MD/NP/PA: Braulio Conte PCP: Mike Gip, FNP  Patient coming from: Home  Chief Complaint: Abdominal pain, fever  HPI: Jay Lawson is a 58 y.o. male resident of [facility] (rehab facility) with medical history significant of anxiety and substance abuse (currently in remission) who presents to the emergency department with complaints of 7-8 days onset of intermittent subjective fever with nasal congestion and 4 days of right lower quadrant pain.  Pain was nonradiating and was of moderate intensity, it was aggravated with movement and alleviated when he lies still, but was associated with decreased appetite and patient states that he has not eaten in the past 48 hours.  He complained of several episodes of vomiting today which resulted in him deciding to come to the ED for further evaluation and management.  Patient was recently seen in the ED on 04/05/2019 due to complaint of fever and upper respiratory tract infection with concern for potential coronavirus.  Patient states that a couple of residents at [facility] house presented with cough/fever and were in quarantine.  Quarantine and social distancing were discussed with him at that time and patient states that he has been staying at a local hotel for about 9-10 days.  He denies chest pain, shortness of breath, headache, diarrhea or constipation.  ED Course:  In the emergency department, who presented with fever 103F, he was tachypneic, but pulse was 97bpm, BP 109/78, O2 sat was 96% on room air.  Work-up in the ED showed normal CBC, hypokalemia(K+=3.0) and hyperglycemia.  Lactic acid was normal at 1.5.  Patient was positive for SARS coronavirus 2.  CT abdomen pelvis with contrast was suspicious for viral pneumonia (including COVID-19), but showed no acute process of abdomen or pelvis.  Chest x-ray showed bilateral interstitial and patchy alveolar  airspace disease concerning for multilobar pneumonia vs atypical or viral pneumonia.  IV hydration per sepsis protocol was provided, patient was treated with acetaminophen due to fever, he was started on IV antibiotics for presumed pneumonia, Reglan was given due to vomiting.  Hospitalist was called to admit him for further evaluation and management considering his positive history for coronavirus.  Review of Systems:   Constitutional: Fever.  Negative for chills  HENT: Nasal congestion.  Negative for ear pain and sore throat.   Eyes: Negative for pain and visual disturbance.  Respiratory: Negative for cough, chest tightness and shortness of breath.   Cardiovascular: Negative for chest pain and palpitations.  Gastrointestinal: Right lower quadrant pain and vomiting  Endocrine: Negative for polyphagia and polyuria.  Genitourinary: Negative for decreased urine volume, dysuria, enuresis, hematuria Musculoskeletal: Negative for arthralgias and back pain.  Skin: Negative for color change and rash.  Neurological: Negative for tremors, syncope, speech difficulty, weakness, light-headedness and headaches.  Hematological: Does not bruise/bleed easily.    Past Medical History:  Diagnosis Date   Anxiety     Past Surgical History:  Procedure Laterality Date   KNEE SURGERY     right knee      reports that he has never smoked. He has never used smokeless tobacco. He reports previous alcohol use. He reports previous drug use.  No Known Allergies  Family History  Problem Relation Age of Onset   Stroke Maternal Aunt    Cancer Maternal Grandfather    Heart disease Paternal Grandmother    Unacceptable: Noncontributory, unremarkable, or negative. Acceptable: Family history reviewed and not pertinent (If you reviewed it)  Prior to Admission medications   Medication Sig Start Date End Date Taking? Authorizing Provider  acetaminophen (TYLENOL) 500 MG tablet Take 500 mg by mouth every 6 (six)  hours as needed for mild pain, moderate pain or headache.   Yes [provider]  gabapentin (NEURONTIN) 600 MG tablet TAKE 2 TABLETS (1,200 MG TOTAL) BY MOUTH AT BEDTIME. Patient taking differently: Take 600 mg by mouth at bedtime.  03/20/19  Yes Mike Gip, FNP  hydrOXYzine (ATARAX/VISTARIL) 50 MG tablet Take 1-2 tablets (50-100 mg total) by mouth at bedtime. Patient taking differently: Take 50 mg by mouth at bedtime.  02/07/19  Yes Mike Gip, FNP  OLANZapine (ZYPREXA) 5 MG tablet Take 5 mg by mouth at bedtime.   Yes [provider]    Physical Exam: Vitals:   04/13/19 2330 04/14/19 0000 04/14/19 0001 04/14/19 0032  BP: 111/73 132/84 132/84 116/66  Pulse: 84 81 77 90  Resp: Temp:   97.6 F (36.4 C)   TempSrc:   Oral   SpO2: 93% 97% 97% 97%      Constitutional: NAD, calm, comfortable Vitals:   04/13/19 2330 04/14/19 0000 04/14/19 0001 04/14/19 0032  BP: 111/73 132/84 132/84 116/66  Pulse: 84 81 77 90  Resp: Temp:   97.6 F (36.4 C)   TempSrc:   Oral   SpO2: 93% 97% 97% 97%   Eyes: PERRL, lids and conjunctivae normal ENMT: Mucous membranes are moist. Posterior pharynx clear of any exudate or lesions.Normal dentition.  Neck: normal, supple, no masses, no thyromegaly Respiratory: clear to auscultation bilaterally, no wheezing, no crackles. Normal respiratory effort. No accessory muscle use.  Cardiovascular: Regular rate and rhythm, no murmurs / rubs / gallops. No extremity edema. 2+ pedal pulses. No carotid bruits.  Abdomen: Mild tenderness I RLQ, no masses palpated. No hepatosplenomegaly. Bowel sounds positive.  Musculoskeletal: no clubbing / cyanosis. No joint deformity upper and lower extremities. Good ROM, no contractures. Normal muscle tone.  Skin: no rashes, lesions, ulcers. No induration Neurologic: CN 2-12 grossly intact. Sensation intact, DTR normal. Strength 5/5 in all 4.  Psychiatric: Normal judgment and insight. Alert  and oriented x 3. Normal mood.   Labs on Admission: I have personally reviewed following labs and imaging studies  CBC: Recent Labs  Lab 04/13/19 1828  WBC 9.8  NEUTROABS 8.5*  HGB 13.3  HCT 40.5  MCV 87.7  PLT 355   Basic Metabolic Panel: Recent Labs  Lab 04/13/19 1828  NA 137  K 3.0*  CL 99  CO2 28  GLUCOSE 175*  BUN 11  CREATININE 1.19  CALCIUM 8.6*   GFR: Estimated Creatinine Clearance: 84.8 mL/min (by C-G formula based on SCr of 1.19 mg/dL). Liver Function Tests: Recent Labs  Lab 04/13/19 1828  AST 47*  ALT 58*  ALKPHOS 107  BILITOT 0.9  PROT 7.5  ALBUMIN 3.7   No results for input(s): LIPASE, AMYLASE in the last 168 hours. No results for input(s): AMMONIA in the last 168 hours. Coagulation Profile: No results for input(s): INR, PROTIME in the last 168 hours. Cardiac Enzymes: No results for input(s): CKTOTAL, CKMB, CKMBINDEX, TROPONINI in the last 168 hours. BNP (last 3 results) No results for input(s): PROBNP in the last 8760 hours. HbA1C: No results for input(s): HGBA1C in the last 72 hours. CBG: No results for input(s): GLUCAP in the last 168 hours. Lipid Profile: No results for input(s): CHOL, HDL, LDLCALC, TRIG, CHOLHDL, LDLDIRECT  in the last 72 hours. Thyroid Function Tests: No results for input(s): TSH, T4TOTAL, FREET4, T3FREE, THYROIDAB in the last 72 hours. Anemia Panel: No results for input(s): VITAMINB12, FOLATE, FERRITIN, TIBC, IRON, RETICCTPCT in the last 72 hours. Urine analysis:    Component Value Date/Time   COLORURINE YELLOW 04/13/2019 2330   APPEARANCEUR CLEAR 04/13/2019 2330   LABSPEC 1.039 (H) 04/13/2019 2330   PHURINE 6.0 04/13/2019 2330   GLUCOSEU NEGATIVE 04/13/2019 2330   HGBUR NEGATIVE 04/13/2019 2330   BILIRUBINUR NEGATIVE 04/13/2019 2330   KETONESUR NEGATIVE 04/13/2019 2330   PROTEINUR 30 (A) 04/13/2019 2330   NITRITE NEGATIVE 04/13/2019 2330   LEUKOCYTESUR NEGATIVE 04/13/2019 2330   Sepsis  Labs: @LABRCNTIP (procalcitonin:4,lacticidven:4) ) Recent Results (from the past 240 hour(s))  SARS Coronavirus 2 (CEPHEID- Performed [facility] lab), Hosp Order     Status: Abnormal   Collection Time: 04/13/19  5:52 PM  Result Value Ref Range Status   SARS Coronavirus 2 POSITIVE (A) NEGATIVE Final    Comment: RESULT CALLED TO, READ BACK BY AND VERIFIED WITH: GARRISON,G @ 2145 ON [id number] BY POTEAT,S (NOTE) If result is NEGATIVE SARS-CoV-2 target nucleic acids are NOT DETECTED. The SARS-CoV-2 RNA is generally detectable in upper and lower  respiratory specimens during the acute phase of infection. The lowest  concentration of SARS-CoV-2 viral copies this assay can detect is 250  copies / mL. A negative result does not preclude SARS-CoV-2 infection  and should not be used as the sole basis for treatment or other  patient management decisions.  A negative result may occur with  improper specimen collection / handling, submission of specimen other  than nasopharyngeal swab, presence of viral mutation(s) within the  areas targeted by this assay, and inadequate number of viral copies  (<250 copies / mL). A negative result must be combined with clinical  observations, patient history, and epidemiological information. If result is POSITIVE SARS-CoV-2 target nucleic acids are DETECTED . The SARS-CoV-2 RNA is generally detectable in upper and lower  respiratory specimens during the acute phase of infection.  Positive  results are indicative of active infection with SARS-CoV-2.  Clinical  correlation with patient history and other diagnostic information is  necessary to determine patient infection status.  Positive results do  not rule out bacterial infection or co-infection with other viruses. If result is PRESUMPTIVE POSTIVE SARS-CoV-2 nucleic acids MAY BE PRESENT.   A presumptive positive result was obtained on the submitted specimen  and confirmed on repeat testing.  While  2019 novel coronavirus  (SARS-CoV-2) nucleic acids may be present in the submitted sample  additional confirmatory testing may be necessary for epidemiological  and / or clinical management purposes  to differentiate between  SARS-CoV-2 and other Sarbecovirus currently known to infect humans.  If clinically indicated additional testing with an alternate test  methodology [phone] ) is advised. The SARS-CoV-2 RNA is generally  detectable in upper and lower respiratory specimens during the acute  phase of infection. The expected result is Negative. Fact Sheet for Patients:  BoilerBrush.com.cyhttps://www.fda.gov/media/136312/download Fact Sheet for Healthcare Providers: https://pope.com/https://www.fda.gov/media/136313/download This test is not yet approved or cleared by the [place] FDA and has been authorized for detection and/or diagnosis of SARS-CoV-2 by FDA under an Emergency Use Authorization (EUA).  This EUA will remain in effect (meaning this test can be used) for the duration of the COVID-19 declaration under Section 564(b)(1) of the Act, 21 U.S.C. section 360bbb-3(b)(1), unless the authorization is terminated or revoked sooner. Performed at  [facility], [address] [place], [place] [address]      Radiological Exams on Admission: Ct Abdomen Pelvis W Contrast  Result Date: 04/13/2019 CLINICAL DATA:  58 y/o M; right lower quadrant abdominal pain, fever, cough. EXAM: CT ABDOMEN AND PELVIS WITH CONTRAST TECHNIQUE: Multidetector CT imaging of the abdomen and pelvis was performed using the standard protocol following bolus administration of intravenous contrast. CONTRAST:  OMNIPAQUE IOHEXOL 300 MG/ML  SOLN COMPARISON:  04/13/2019 chest radiograph FINDINGS: Lower chest: Patchy ground-glass opacities within the lung bases and peripheral and bronchovascular distribution. Left lower lobe calcified granuloma. Hepatobiliary: No focal liver abnormality is seen. No gallstones, gallbladder wall  thickening, or biliary dilatation. Pancreas: Unremarkable. No pancreatic ductal dilatation or surrounding inflammatory changes. Spleen: Scattered calcified granulomas. Lobulated contour. Adrenals/Urinary Tract: Adrenal glands are unremarkable. Kidneys are normal, without renal calculi, focal lesion, or hydronephrosis. Bladder is unremarkable. Stomach/Bowel: Stomach is within normal limits. No evidence of bowel wall thickening, distention, or inflammatory changes. Appendix is borderline in diameter measuring 6 mm, no secondary signs of acute appendicitis. Vascular/Lymphatic: No significant vascular findings are present. No enlarged abdominal or pelvic lymph nodes. Reproductive: Prostate is unremarkable. Other: Left inguinal hernia containing fat. Musculoskeletal: No fracture is seen. Degenerative changes of the lumbar spine with prominent facet arthropathy. IMPRESSION: 1. There are a spectrum of findings in the lungs which can be seen with acute atypical infection (as well as other non-infectious etiologies). In particular, viral pneumonia (including COVID-19) should be considered in the appropriate clinical setting. 2. No acute process of abdomen or pelvis identified. Appendix is borderline in diameter, however, there are no secondary signs of acute appendicitis and this is likely a normal variant. Critical Value/emergent results were called by telephone at the time of interpretation on 04/13/2019 at 8:40 pm to Dr. Arthor Captain , who verbally acknowledged these results. Electronically Signed   By: Mitzi Hansen M.D.   On: 04/13/2019 20:41   Dg Chest Port 1 View  Result Date: 04/13/2019 CLINICAL DATA:  He tells [place] he has had fevers and occasional vomiting x 4 days. Nonsmoker. EXAM: PORTABLE CHEST 1 VIEW COMPARISON:  None. FINDINGS: Bilateral patchy areas of airspace disease with interstitial thickening. No pleural effusion or pneumothorax. Stable cardiomediastinal silhouette. No aggressive osseous  lesion. IMPRESSION: Bilateral interstitial and patchy alveolar airspace disease concerning for multilobar pneumonia versus atypical or viral pneumonia. Electronically Signed   By: Elige Ko   On: 04/13/2019 18:41    EKG: Independently reviewed.   Assessment/Plan Principal Problem:   Real time reverse transcriptase PCR positive for COVID-19 virus Active Problems:   Substance abuse in remission (HCC)   Opioid type dependence, abuse (HCC)   Sleep apnea   Pneumonia due to COVID-19 virus   Elevated transaminase level   Hypokalemia   Nausea and vomiting   Sepsis (HCC)   Abdominal pain   Real time reverse transcriptase PCR positive for COVID-19 virus Pneumonia due to COVID-19 virus Sepsis secondary to above Patient was febrile and tachypneic, thereby having 2 positive SIRS, with chest x-ray suspicious for pneumonia.  Patient tested positive for SARS coronavirus 2. CT abdomen and pelvis with contrast was suspicious for viral pneumonia (including COVID-19).  IV hydration per sepsis protocol was provided, patient was started on IV antibiotics (Cefepime, Metronidazole and Vancomycin).  However, patient does not appear septic clinically.  He presented with normal WBC, lactic acid was negative.  Procalcitonin will be checked prior to continuing with antibiotics at this time. Continue  Tylenol as needed for fever Continue droplet precaution Continue supplemental oxygen to maintain O2 sat > 92% with plan to wean patient off supplemental oxygen as tolerated.  Hypokalemia K+ 3.0, this will be replenished  Nausea and vomiting Continue IV Zofran/Phenergan as needed  Abdominal pain CT abdomen and pelvis showed no acute findings Patient currently not complaining of any abdominal pain.   Elevated transaminase level AST 47, ALT 58 Continue to monitor liver panel  Sleep apnea on CPAP at night Continue CPAP  Substance abuse in remission Opioid type dependence Stable  DVT prophylaxis:  Lovenox, SCDs Code Status: Full  Family Communication: No family at bedside Disposition Plan: Patient will be admitted to inpatient with plan to discharge patient home within the next 48 to 72 hours Consults called: None Admission status: Inpatient,SDU   Frankey Shown MD [facility] If 7AM-7PM, please contact day Physician  04/14/2019, 12:39 AM

## 2019-04-13 NOTE — Progress Notes (Signed)
A consult was received from an ED physician for vancomycin and cefepime per pharmacy dosing (for an indication other than meningitis). The patient's profile has been reviewed for ht/wt/allergies/indication/available labs. A one time order has been placed for the above antibiotics.  Further antibiotics/pharmacy consults should be ordered by admitting physician if indicated.                       Bernadene Person, PharmD, BCPS [phone] 04/13/2019, 5:41 PM

## 2019-04-13 NOTE — ED Notes (Signed)
Report given to [facility] [place] RN 

## 2019-04-14 ENCOUNTER — Other Ambulatory Visit: Payer: Self-pay

## 2019-04-14 DIAGNOSIS — A419 Sepsis, unspecified organism: Secondary | ICD-10-CM

## 2019-04-14 DIAGNOSIS — E876 Hypokalemia: Secondary | ICD-10-CM

## 2019-04-14 DIAGNOSIS — R740 Nonspecific elevation of levels of transaminase and lactic acid dehydrogenase [LDH]: Secondary | ICD-10-CM

## 2019-04-14 DIAGNOSIS — U071 COVID-19: Secondary | ICD-10-CM

## 2019-04-14 DIAGNOSIS — R7401 Elevation of levels of liver transaminase levels: Secondary | ICD-10-CM

## 2019-04-14 DIAGNOSIS — R109 Unspecified abdominal pain: Secondary | ICD-10-CM

## 2019-04-14 DIAGNOSIS — R112 Nausea with vomiting, unspecified: Secondary | ICD-10-CM

## 2019-04-14 DIAGNOSIS — J1289 Other viral pneumonia: Secondary | ICD-10-CM

## 2019-04-14 LAB — CBC WITH DIFFERENTIAL/PLATELET
Abs Immature Granulocytes: 0.07 10*3/uL (ref 0.00–0.07)
Basophils Absolute: 0 10*3/uL (ref 0.0–0.1)
Basophils Relative: 0 %
Eosinophils Absolute: 0 10*3/uL (ref 0.0–0.5)
Eosinophils Relative: 0 %
HCT: 35.8 % — ABNORMAL LOW (ref 39.0–52.0)
Hemoglobin: 11.9 g/dL — ABNORMAL LOW (ref 13.0–17.0)
Immature Granulocytes: 1 %
Lymphocytes Relative: 15 %
Lymphs Abs: 1.4 10*3/uL (ref 0.7–4.0)
MCH: 29 pg (ref 26.0–34.0)
MCHC: 33.2 g/dL (ref 30.0–36.0)
MCV: 87.1 fL (ref 80.0–100.0)
Monocytes Absolute: 0.5 10*3/uL (ref 0.1–1.0)
Monocytes Relative: 5 %
Neutro Abs: 7.4 10*3/uL (ref 1.7–7.7)
Neutrophils Relative %: 79 %
Platelets: 352 10*3/uL (ref 150–400)
RBC: 4.11 MIL/uL — ABNORMAL LOW (ref 4.22–5.81)
RDW: 13 % (ref 11.5–15.5)
WBC: 9.3 10*3/uL (ref 4.0–10.5)
nRBC: 0 % (ref 0.0–0.2)

## 2019-04-14 LAB — COMPREHENSIVE METABOLIC PANEL
ALT: 49 U/L — ABNORMAL HIGH (ref 0–44)
AST: 45 U/L — ABNORMAL HIGH (ref 15–41)
Albumin: 3.2 g/dL — ABNORMAL LOW (ref 3.5–5.0)
Alkaline Phosphatase: 82 U/L (ref 38–126)
Anion gap: 8 (ref 5–15)
BUN: 10 mg/dL (ref 6–20)
CO2: 25 mmol/L (ref 22–32)
Calcium: 8.2 mg/dL — ABNORMAL LOW (ref 8.9–10.3)
Chloride: 105 mmol/L (ref 98–111)
Creatinine, Ser: 1.02 mg/dL (ref 0.61–1.24)
GFR calc Af Amer: >60 mL/min (ref >60)
GFR calc non Af Amer: >60 mL/min (ref >60)
Glucose, Bld: 148 mg/dL — ABNORMAL HIGH (ref 70–99)
Potassium: 3.1 mmol/L — ABNORMAL LOW (ref 3.5–5.1)
Sodium: 138 mmol/L (ref 135–145)
Total Bilirubin: 0.9 mg/dL (ref 0.3–1.2)
Total Protein: 6.8 g/dL (ref 6.5–8.1)

## 2019-04-14 LAB — URINALYSIS, ROUTINE W REFLEX MICROSCOPIC
Bacteria, UA: NONE SEEN
Bilirubin Urine: NEGATIVE
Glucose, UA: NEGATIVE mg/dL
Hgb urine dipstick: NEGATIVE
Ketones, ur: NEGATIVE mg/dL
Leukocytes,Ua: NEGATIVE
Nitrite: NEGATIVE
Protein, ur: 30 mg/dL — AB
Specific Gravity, Urine: 1.039 — ABNORMAL HIGH (ref 1.005–1.030)
pH: 6 (ref 5.0–8.0)

## 2019-04-14 LAB — MAGNESIUM: Magnesium: 1.9 mg/dL (ref 1.7–2.4)

## 2019-04-14 LAB — D-DIMER, QUANTITATIVE: D-Dimer, Quant: 2.49 ug/mL-FEU — ABNORMAL HIGH (ref 0.00–0.50)

## 2019-04-14 LAB — PHOSPHORUS: Phosphorus: 2.1 mg/dL — ABNORMAL LOW (ref 2.5–4.6)

## 2019-04-14 LAB — PROCALCITONIN: Procalcitonin: 2.48 ng/mL

## 2019-04-14 LAB — C-REACTIVE PROTEIN: CRP: 19.4 mg/dL — ABNORMAL HIGH (ref <1.0)

## 2019-04-14 LAB — FERRITIN: Ferritin: 346 ng/mL — ABNORMAL HIGH (ref 24–336)

## 2019-04-14 MED ORDER — ONDANSETRON HCL 4 MG/2ML IJ SOLN: 4.0000 mg | Freq: Four times a day (QID) | INTRAMUSCULAR | Status: DC | PRN

## 2019-04-14 MED ORDER — ENOXAPARIN SODIUM 40 MG/0.4ML ~~LOC~~ SOLN
40.0000 mg | SUBCUTANEOUS | Status: DC
  Administered 2019-04-14 – 2019-04-16 (×3): 40 mg via SUBCUTANEOUS
  Filled 2019-04-14 (×3): qty 0.4

## 2019-04-14 MED ORDER — POTASSIUM CHLORIDE CRYS ER 20 MEQ PO TBCR
40.0000 meq | EXTENDED_RELEASE_TABLET | Freq: Four times a day (QID) | ORAL | Status: AC
  Administered 2019-04-14 (×2): 40 meq via ORAL
  Filled 2019-04-14 (×2): qty 2

## 2019-04-14 MED ORDER — ACETAMINOPHEN 325 MG PO TABS
650.0000 mg | ORAL_TABLET | Freq: Four times a day (QID) | ORAL | Status: DC | PRN
  Administered 2019-04-14: 05:00:00 650 mg via ORAL
  Filled 2019-04-14: qty 2

## 2019-04-14 MED ORDER — PROMETHAZINE HCL 25 MG/ML IJ SOLN: 12.5000 mg | Freq: Four times a day (QID) | INTRAMUSCULAR | Status: DC | PRN

## 2019-04-14 NOTE — ED Notes (Signed)
[facility] called, report given.  

## 2019-04-14 NOTE — Progress Notes (Signed)
PROGRESS NOTE                                                                                                                                                                                                             Patient Demographics:    Jay Lawson, is a 58 y.o. male, DOB - 20-Aug-1961, ZOX:[id number]  Outpatient Primary MD for the patient is Mike Gip, FNP    LOS - 1  Chief Complaint  Patient presents with   Abdominal Pain   Fever       Brief Narrative  Jay Lawson is a 58 y.o. male resident of [facility] (rehab facility) with medical history significant of anxiety and substance abuse (currently in remission) who presents to the emergency department with complaints of 7-8 days onset of intermittent subjective fever with nasal congestion and 4 days of right lower quadrant pain.  Was diagnosed with COVID-19 infection, CT scan abdomen pelvis was nonspecific but low to no suspicion for acute appendicitis.   Subjective:    Jay Lawson today has, No headache, No chest pain, No abdominal pain - No Nausea, No new weakness tingling or numbness, no cough - SOB.     Assessment  & Plan :   1. Acute Covid 19 Viral Illness during the ongoing 2020 Covid 19 Pandemic -stable, currently no hypoxia , low grade fever, continue supportive care and monitor.  COVID-19 Labs  Recent Labs    04/14/19 0823  DDIMER 2.49*  FERRITIN 346*  CRP 19.4*    Lab Results  Component Value Date   SARSCOV2NAA POSITIVE (A) 04/13/2019     2.  Hypokalemia - replaced.  3.  RLQ abdominal pain with nonspecific CT scan findings.  Pain has resolved, no right upper quadrant tenderness, no leukocytosis.  Monitor.    Condition - Fair  Family Communication  :  None  Code Status :  Full  Diet : Regular  Disposition Plan  :  TBD  Consults  :  None  Procedures  :    CT Abd Pelvis - PNA    DVT Prophylaxis  :  Lovenox     Lab Results  Component Value Date   PLT 352 04/14/2019    Inpatient Medications  Scheduled Meds:  enoxaparin (LOVENOX) injection  40 mg Subcutaneous Q24H   potassium chloride  40 mEq  Oral Q6H   Continuous Infusions: PRN Meds:.acetaminophen, ondansetron (ZOFRAN) IV  Antibiotics  :    Anti-infectives (From admission, onward)   Start     Dose/Rate Route Frequency Ordered Stop   04/13/19 1745  ceFEPIme (MAXIPIME) 2 g in sodium chloride 0.9 % 100 mL IVPB     2 g 200 mL/hr over 30 Minutes Intravenous  Once 04/13/19 1734 04/13/19 1930   04/13/19 1745  metroNIDAZOLE (FLAGYL) IVPB 500 mg     500 mg 100 mL/hr over 60 Minutes Intravenous  Once 04/13/19 1734 04/13/19 2248   04/13/19 1745  vancomycin (VANCOCIN) IVPB 1000 mg/200 mL premix     1,000 mg 200 mL/hr over 60 Minutes Intravenous  Once 04/13/19 1734 04/13/19 2248       Time Spent in minutes  30   Susa Raring M.D on 04/14/2019 at 10:32 AM  To page go to [email or web address] - password [email or web address]  [facility] -  Office  [phone]  See all Orders from today for further details   Admit date - 04/13/2019    1    Objective:   Vitals:   04/14/19 0032 04/14/19 0112 04/14/19 0432 04/14/19 0723  BP: 116/66 (!) 124/57 (!) 144/61 118/70  Pulse: 90 79 91 84  Resp: 18 (!) 28 20   Temp:  99.3 F (37.4 C) (!) 101.9 F (38.8 C) (!) 100.5 F (38.1 C)  TempSrc:    Oral  SpO2: 97% 100% 94% 93%  Weight:  105.2 kg    Height:   (1.778 m)      Wt Readings from Last 3 Encounters:  04/14/19 105.2 kg  04/05/19 104.3 kg  02/21/19 103.9 kg     Intake/Output Summary (Last 24 hours) at 04/14/2019 1032 Last data filed at 04/13/2019 2248 Gross per 24 hour  Intake 3900 ml  Output --  Net 3900 ml     Physical Exam  Awake Alert, Oriented X 3, No new F.N deficits, Normal affect [place].AT,PERRAL Supple Neck,No JVD, No cervical lymphadenopathy appriciated.  Symmetrical Chest wall movement, Good air movement bilaterally,  CTAB RRR,No Gallops,Rubs or new Murmurs, No Parasternal Heave +ve B.Sounds, Abd Soft, No tenderness, No organomegaly appriciated, No rebound - guarding or rigidity. No Cyanosis, Clubbing or edema, No new Rash or bruise     Data Review:    CBC Recent Labs  Lab 04/13/19 1828 04/14/19 0823  WBC 9.8 9.3  HGB 13.3 11.9*  HCT 40.5 35.8*  PLT 355 352  MCV 87.7 87.1  MCH 28.8 29.0  MCHC 32.8 33.2  RDW 12.9 13.0  LYMPHSABS 0.9 PENDING  MONOABS 0.3 PENDING  EOSABS 0.0 PENDING  BASOSABS 0.0 PENDING    Chemistries  Recent Labs  Lab 04/13/19 1828 04/14/19 0823  NA 137 138  K 3.0* 3.1*  CL 99 105  CO2 28 25  GLUCOSE 175* 148*  BUN 11 10  CREATININE 1.19 1.02  CALCIUM 8.6* 8.2*  MG  --  1.9  AST 47* 45*  ALT 58* 49*  ALKPHOS 107 82  BILITOT 0.9 0.9   ------------------------------------------------------------------------------------------------------------------ No results for input(s): CHOL, HDL, LDLCALC, TRIG, CHOLHDL, LDLDIRECT in the last 72 hours.  No results found for: HGBA1C ------------------------------------------------------------------------------------------------------------------ No results for input(s): TSH, T4TOTAL, T3FREE, THYROIDAB in the last 72 hours.  Invalid input(s): FREET3  Cardiac Enzymes No results for input(s): CKMB, TROPONINI, MYOGLOBIN in the last 168 hours.  Invalid input(s): CK ------------------------------------------------------------------------------------------------------------------ No results found for: BNP  Micro Results Recent Results (from the past  240 hour(s))  SARS Coronavirus 2 (CEPHEID- Performed in [facility] hospital lab), Hosp Order     Status: Abnormal   Collection Time: 04/13/19  5:52 PM  Result Value Ref Range Status   SARS Coronavirus 2 POSITIVE (A) NEGATIVE Final    Comment: RESULT CALLED TO, READ BACK BY AND VERIFIED WITH: GARRISON,G @ 2145 ON [id number] BY POTEAT,S (NOTE) If result is NEGATIVE SARS-CoV-2  target nucleic acids are NOT DETECTED. The SARS-CoV-2 RNA is generally detectable in upper and lower  respiratory specimens during the acute phase of infection. The lowest  concentration of SARS-CoV-2 viral copies this assay can detect is 250  copies / mL. A negative result does not preclude SARS-CoV-2 infection  and should not be used as the sole basis for treatment or other  patient management decisions.  A negative result may occur with  improper specimen collection / handling, submission of specimen other  than nasopharyngeal swab, presence of viral mutation(s) within the  areas targeted by this assay, and inadequate number of viral copies  (<250 copies / mL). A negative result must be combined with clinical  observations, patient history, and epidemiological information. If result is POSITIVE SARS-CoV-2 target nucleic acids are DETECTED . The SARS-CoV-2 RNA is generally detectable in upper and lower  respiratory specimens during the acute phase of infection.  Positive  results are indicative of active infection with SARS-CoV-2.  Clinical  correlation with patient history and other diagnostic information is  necessary to determine patient infection status.  Positive results do  not rule out bacterial infection or co-infection with other viruses. If result is PRESUMPTIVE POSTIVE SARS-CoV-2 nucleic acids MAY BE PRESENT.   A presumptive positive result was obtained on the submitted specimen  and confirmed on repeat testing.  While 2019 novel coronavirus  (SARS-CoV-2) nucleic acids may be present in the submitted sample  additional confirmatory testing may be necessary for epidemiological  and / or clinical management purposes  to differentiate between  SARS-CoV-2 and other Sarbecovirus currently known to infect humans.  If clinically indicated additional testing with an alternate test  methodology [phone] ) is advised. The SARS-CoV-2 RNA is generally  detectable in upper and lower  respiratory specimens during the acute  phase of infection. The expected result is Negative. Fact Sheet for Patients:  BoilerBrush.com.cy Fact Sheet for Healthcare Providers: https://pope.com/ This test is not yet approved or cleared by the [place] FDA and has been authorized for detection and/or diagnosis of SARS-CoV-2 by FDA under an Emergency Use Authorization (EUA).  This EUA will remain in effect (meaning this test can be used) for the duration of the COVID-19 declaration under Section 564(b)(1) of the Act, 21 U.S.C. section 360bbb-3(b)(1), unless the authorization is terminated or revoked sooner. Performed at [facility], [address] [place], [place] [address]   Blood Culture (routine x 2)     Status: None (Preliminary result)   Collection Time: 04/13/19  5:57 PM  Result Value Ref Range Status   Specimen Description   Final    BLOOD BLOOD RIGHT FOREARM Performed at [facility], [address], [place], [place] [address]    Special Requests   Final    BOTTLES DRAWN AEROBIC AND ANAEROBIC Blood Culture results may not be optimal due to an inadequate volume of blood received in culture bottles Performed at [facility], [address] [place], [place] [address]    Culture   Final    NO GROWTH <  12 HOURS Performed at [facility], [address] [place], [place] [address]    Report Status PENDING  Incomplete  Blood Culture (routine x 2)     Status: None (Preliminary result)   Collection Time: 04/13/19  6:28 PM  Result Value Ref Range Status   Specimen Description   Final    BLOOD LEFT ANTECUBITAL Performed at [facility], [address] [place], [place] [address]    Special Requests   Final    BOTTLES DRAWN AEROBIC AND ANAEROBIC Blood Culture results may not be optimal due to an excessive volume of blood received in culture  bottles Performed at [facility], [address] [place], [place] [address]    Culture   Final    NO GROWTH < 12 HOURS Performed at [facility], [address] [place], [place] [address]    Report Status PENDING  Incomplete    Radiology Reports Ct Abdomen Pelvis W Contrast  Result Date: 04/13/2019 CLINICAL DATA:  58 y/o M; right lower quadrant abdominal pain, fever, cough. EXAM: CT ABDOMEN AND PELVIS WITH CONTRAST TECHNIQUE: Multidetector CT imaging of the abdomen and pelvis was performed using the standard protocol following bolus administration of intravenous contrast. CONTRAST:  100mL OMNIPAQUE IOHEXOL 300 MG/ML  SOLN COMPARISON:  04/13/2019 chest radiograph FINDINGS: Lower chest: Patchy ground-glass opacities within the lung bases and peripheral and bronchovascular distribution. Left lower lobe calcified granuloma. Hepatobiliary: No focal liver abnormality is seen. No gallstones, gallbladder wall thickening, or biliary dilatation. Pancreas: Unremarkable. No pancreatic ductal dilatation or surrounding inflammatory changes. Spleen: Scattered calcified granulomas. Lobulated contour. Adrenals/Urinary Tract: Adrenal glands are unremarkable. Kidneys are normal, without renal calculi, focal lesion, or hydronephrosis. Bladder is unremarkable. Stomach/Bowel: Stomach is within normal limits. No evidence of bowel wall thickening, distention, or inflammatory changes. Appendix is borderline in diameter measuring 6 mm, no secondary signs of acute appendicitis. Vascular/Lymphatic: No significant vascular findings are present. No enlarged abdominal or pelvic lymph nodes. Reproductive: Prostate is unremarkable. Other: Left inguinal hernia containing fat. Musculoskeletal: No fracture is seen. Degenerative changes of the lumbar spine with prominent facet arthropathy. IMPRESSION: 1. There are a spectrum of findings in the lungs which can be seen with acute atypical infection (as well as  other non-infectious etiologies). In particular, viral pneumonia (including COVID-19) should be considered in the appropriate clinical setting. 2. No acute process of abdomen or pelvis identified. Appendix is borderline in diameter, however, there are no secondary signs of acute appendicitis and this is likely a normal variant. Critical Value/emergent results were called by telephone at the time of interpretation on 04/13/2019 at 8:40 pm to Dr. Arthor CaptainABIGAIL HARRIS , who verbally acknowledged these results. Electronically Signed   By: Mitzi HansenLance  Furusawa-Stratton M.D.   On: 04/13/2019 20:41   Dg Chest Port 1 View  Result Date: 04/13/2019 CLINICAL DATA:  He tells us he has had fevers and occasional vomiting x 4 days. Nonsmoker. EXAM: PORTABLE CHEST 1 VIEW COMPARISON:  None. FINDINGS: Bilateral patchy areas of airspace disease with interstitial thickening. No pleural effusion or pneumothorax. Stable cardiomediastinal silhouette. No aggressive osseous lesion. IMPRESSION: Bilateral interstitial and patchy alveolar airspace disease concerning for multilobar pneumonia versus atypical or viral pneumonia. Electronically Signed   By: Elige KoHetal  Patel   On: 04/13/2019 18:41

## 2019-04-14 NOTE — Progress Notes (Signed)
CSW acknowledges consult that patient is from [facility] rehab facility with a history of substance abuse. CSW will continue to follow for discharge planning needs.   [place], [place] [phone]

## 2019-04-14 NOTE — ED Notes (Signed)
[facility] at bedside 

## 2019-04-14 NOTE — Progress Notes (Signed)
CSW spoke with Nature conservation officer at [facility] who reports T Ellick has been at their facility for 2 months. He reports that in order for patient to return to [facility] he would need at least one negative test. He reports their facility does not allow for patient to be in isolation as it is community living, therefore it is important for him to be negative for COVID before returning to [facility] in order to prevent spread. He reports in future CSW should call [facility] on main number at [phone] and ask for [employer] who is the Physiological scientist, however he is out of town currently.   CSW will continue to follow for dc planning.   [place], [place] [phone]

## 2019-04-15 LAB — CBC WITH DIFFERENTIAL/PLATELET
Abs Immature Granulocytes: 0.06 10*3/uL (ref 0.00–0.07)
Basophils Absolute: 0 10*3/uL (ref 0.0–0.1)
Basophils Relative: 0 %
Eosinophils Absolute: 0 10*3/uL (ref 0.0–0.5)
Eosinophils Relative: 0 %
HCT: 37 % — ABNORMAL LOW (ref 39.0–52.0)
Hemoglobin: 11.8 g/dL — ABNORMAL LOW (ref 13.0–17.0)
Immature Granulocytes: 1 %
Lymphocytes Relative: 22 %
Lymphs Abs: 1.6 10*3/uL (ref 0.7–4.0)
MCH: 28.6 pg (ref 26.0–34.0)
MCHC: 31.9 g/dL (ref 30.0–36.0)
MCV: 89.8 fL (ref 80.0–100.0)
Monocytes Absolute: 0.5 10*3/uL (ref 0.1–1.0)
Monocytes Relative: 7 %
Neutro Abs: 5.1 10*3/uL (ref 1.7–7.7)
Neutrophils Relative %: 70 %
Platelets: 380 10*3/uL (ref 150–400)
RBC: 4.12 MIL/uL — ABNORMAL LOW (ref 4.22–5.81)
RDW: 13 % (ref 11.5–15.5)
WBC: 7.3 10*3/uL (ref 4.0–10.5)
nRBC: 0 % (ref 0.0–0.2)

## 2019-04-15 LAB — COMPREHENSIVE METABOLIC PANEL
ALT: 54 U/L — ABNORMAL HIGH (ref 0–44)
AST: 54 U/L — ABNORMAL HIGH (ref 15–41)
Albumin: 3.1 g/dL — ABNORMAL LOW (ref 3.5–5.0)
Alkaline Phosphatase: 78 U/L (ref 38–126)
Anion gap: 9 (ref 5–15)
BUN: 10 mg/dL (ref 6–20)
CO2: 25 mmol/L (ref 22–32)
Calcium: 8.7 mg/dL — ABNORMAL LOW (ref 8.9–10.3)
Chloride: 107 mmol/L (ref 98–111)
Creatinine, Ser: 0.91 mg/dL (ref 0.61–1.24)
GFR calc Af Amer: >60 mL/min (ref >60)
GFR calc non Af Amer: >60 mL/min (ref >60)
Glucose, Bld: 119 mg/dL — ABNORMAL HIGH (ref 70–99)
Potassium: 3.3 mmol/L — ABNORMAL LOW (ref 3.5–5.1)
Sodium: 141 mmol/L (ref 135–145)
Total Bilirubin: 0.9 mg/dL (ref 0.3–1.2)
Total Protein: 6.8 g/dL (ref 6.5–8.1)

## 2019-04-15 LAB — FERRITIN: Ferritin: 403 ng/mL — ABNORMAL HIGH (ref 24–336)

## 2019-04-15 LAB — D-DIMER, QUANTITATIVE: D-Dimer, Quant: 2.08 ug/mL-FEU — ABNORMAL HIGH (ref 0.00–0.50)

## 2019-04-15 LAB — C-REACTIVE PROTEIN: CRP: 17.9 mg/dL — ABNORMAL HIGH (ref <1.0)

## 2019-04-15 LAB — HIV ANTIBODY (ROUTINE TESTING W REFLEX): HIV Screen 4th Generation wRfx: NONREACTIVE

## 2019-04-15 LAB — PROCALCITONIN: Procalcitonin: 1.46 ng/mL

## 2019-04-15 MED ORDER — K PHOS MONO-SOD PHOS DI & MONO 155-852-130 MG PO TABS
500.0000 mg | ORAL_TABLET | Freq: Three times a day (TID) | ORAL | Status: DC
  Administered 2019-04-15 – 2019-04-16 (×5): 500 mg via ORAL
  Filled 2019-04-15 (×10): qty 2

## 2019-04-15 MED ORDER — POTASSIUM CHLORIDE CRYS ER 20 MEQ PO TBCR
40.0000 meq | EXTENDED_RELEASE_TABLET | Freq: Once | ORAL | Status: AC
  Administered 2019-04-15: 40 meq via ORAL
  Filled 2019-04-15: qty 2

## 2019-04-15 MED ORDER — POTASSIUM PHOSPHATE MONOBASIC 500 MG PO TABS: 500.0000 mg | ORAL_TABLET | Freq: Three times a day (TID) | ORAL | Status: DC

## 2019-04-15 NOTE — Progress Notes (Signed)
A close friend of the patient's (the mother of his child) called to inform us the patient has some pertinent medical history we may not be aware of: Hx of: seizures as a child, Atrial fibrillation and the patient gets pneumonia several times a year.  Per the patient, healthcare staff are able to speak to this woman: Dion Saucier: [phone].  Patient's nurse Abby notified. Asher Muir Corrin Sieling,RN

## 2019-04-15 NOTE — Progress Notes (Signed)
PROGRESS NOTE                                                                                                                                                                                                             Patient Demographics:    Jay Lawson, is a 58 y.o. male, DOB - 27-Mar-1961, RAX:[id number]  Outpatient Primary MD for the patient is Mike Gip, FNP    LOS - 2  Chief Complaint  Patient presents with   Abdominal Pain   Fever       Brief Narrative  Jay Lawson is a 58 y.o. male resident of [facility] (rehab facility) with medical history significant of anxiety and substance abuse (currently in remission) who presents to the emergency department with complaints of 7-8 days onset of intermittent subjective fever with nasal congestion and 4 days of right lower quadrant pain.  Was diagnosed with COVID-19 infection, CT scan abdomen pelvis was nonspecific but low to no suspicion for acute appendicitis.   Subjective:   Patient in bed, appears comfortable, denies any headache, no fever, no chest pain or pressure, minimal shortness of breath , no RLQ abdominal pain. No focal weakness.   Assessment  & Plan :   1. Acute Covid 19 Viral Illness during the ongoing 2020 Covid 19 Pandemic -continues to stay stable, low-grade fevers, mild shortness of breath on exertion, inflammatory markers remain stable.  Continue to monitor advance activity likely discharge in 1 to 2 days.  Management requested to assist with discharge.  He is technically from a halfway house here who will not take him till he is negative and his nasal swab which might take 4 to 6 weeks.  COVID-19 Labs  Recent Labs    04/14/19 0823 04/15/19 0405  DDIMER 2.49* 2.08*  FERRITIN 346* 403*  CRP 19.4* 17.9*    Lab Results  Component Value Date   SARSCOV2NAA POSITIVE (A) 04/13/2019     2.  Hypokalemia - replaced.  3.  RLQ abdominal pain  with nonspecific CT scan findings.  Pain has completely resolved, no right upper quadrant tenderness, no leukocytosis.  Monitor.  4. Hypophosphatemia.  Replaced.    Condition - Fair  Family Communication  :  None  Code Status :  Full  Diet : Regular  Disposition Plan  : Per case management  Consults  :  None  Procedures  :    CT Abd Pelvis - PNA    DVT Prophylaxis  :  Lovenox    Lab Results  Component Value Date   PLT 380 04/15/2019    Inpatient Medications  Scheduled Meds:  enoxaparin (LOVENOX) injection  40 mg Subcutaneous Q24H   potassium phosphate (monobasic)  500 mg Oral TID WC & HS   Continuous Infusions: PRN Meds:.acetaminophen, ondansetron (ZOFRAN) IV  Antibiotics  :    Anti-infectives (From admission, onward)   Start     Dose/Rate Route Frequency Ordered Stop   04/13/19 1745  ceFEPIme (MAXIPIME) 2 g in sodium chloride 0.9 % 100 mL IVPB     2 g 200 mL/hr over 30 Minutes Intravenous  Once 04/13/19 1734 04/13/19 1930   04/13/19 1745  metroNIDAZOLE (FLAGYL) IVPB 500 mg     500 mg 100 mL/hr over 60 Minutes Intravenous  Once 04/13/19 1734 04/13/19 2248   04/13/19 1745  vancomycin (VANCOCIN) IVPB 1000 mg/200 mL premix     1,000 mg 200 mL/hr over 60 Minutes Intravenous  Once 04/13/19 1734 04/13/19 2248       Time Spent in minutes  30   Susa Raring M.D on 04/15/2019 at 9:27 AM  To page go to [email or web address] - password [email or web address]  [facility] -  Office  [phone]  See all Orders from today for further details   Admit date - 04/13/2019    2    Objective:   Vitals:   04/14/19 2100 04/15/19 0013 04/15/19 0417 04/15/19 0837  BP:    132/73  Pulse:    69  Resp:    18  Temp: 99.5 F (37.5 C) 99.4 F (37.4 C) 98.9 F (37.2 C) 98.5 F (36.9 C)  TempSrc: Oral Oral Oral Oral  SpO2:    97%  Weight:      Height:        Wt Readings from Last 3 Encounters:  04/14/19 105.2 kg  04/05/19 104.3 kg  02/21/19 103.9 kg     Intake/Output  Summary (Last 24 hours) at 04/15/2019 0927 Last data filed at 04/15/2019 0858 Gross per 24 hour  Intake 480 ml  Output --  Net 480 ml     Physical Exam  Awake Alert, Oriented X 3, No new F.N deficits, Normal affect [place].AT,PERRAL Supple Neck,No JVD, No cervical lymphadenopathy appriciated.  Symmetrical Chest wall movement, Good air movement bilaterally, CTAB RRR,No Gallops, Rubs or new Murmurs, No Parasternal Heave +ve B.Sounds, Abd Soft, No tenderness, No organomegaly appriciated, No rebound - guarding or rigidity. No Cyanosis, Clubbing or edema, No new Rash or bruise    Data Review:    CBC Recent Labs  Lab 04/13/19 1828 04/14/19 0823 04/15/19 0405  WBC 9.8 9.3 7.3  HGB 13.3 11.9* 11.8*  HCT 40.5 35.8* 37.0*  PLT 355 352 380  MCV 87.7 87.1 89.8  MCH 28.8 29.0 28.6  MCHC 32.8 33.2 31.9  RDW 12.9 13.0 13.0  LYMPHSABS 0.9 1.4 1.6  MONOABS 0.3 0.5 0.5  EOSABS 0.0 0.0 0.0  BASOSABS 0.0 0.0 0.0    Chemistries  Recent Labs  Lab 04/13/19 1828 04/14/19 0823 04/15/19 0405  NA 137 138 141  K 3.0* 3.1* 3.3*  CL 99 105 107  CO2 GLUCOSE 175* 148* 119*  BUN CREATININE 1.19 1.02 0.91  CALCIUM 8.6* 8.2* 8.7*  MG  --  1.9  --   AST 47* 45* 54*  ALT 58* 49* 54*  ALKPHOS 107 82 78  BILITOT 0.9 0.9 0.9   ------------------------------------------------------------------------------------------------------------------ No results for input(s): CHOL, HDL, LDLCALC, TRIG, CHOLHDL, LDLDIRECT in the last 72 hours.  No results found for: HGBA1C ------------------------------------------------------------------------------------------------------------------ No results for input(s): TSH, T4TOTAL, T3FREE, THYROIDAB in the last 72 hours.  Invalid input(s): FREET3  Cardiac Enzymes No results for input(s): CKMB, TROPONINI, MYOGLOBIN in the last 168 hours.  Invalid input(s):  CK ------------------------------------------------------------------------------------------------------------------ No results found for: BNP  Micro Results Recent Results (from the past 240 hour(s))  SARS Coronavirus 2 (CEPHEID- Performed in [facility] hospital lab), Hosp Order     Status: Abnormal   Collection Time: 04/13/19  5:52 PM  Result Value Ref Range Status   SARS Coronavirus 2 POSITIVE (A) NEGATIVE Final    Comment: RESULT CALLED TO, READ BACK BY AND VERIFIED WITH: GARRISON,G @ 2145 ON [id number] BY POTEAT,S (NOTE) If result is NEGATIVE SARS-CoV-2 target nucleic acids are NOT DETECTED. The SARS-CoV-2 RNA is generally detectable in upper and lower  respiratory specimens during the acute phase of infection. The lowest  concentration of SARS-CoV-2 viral copies this assay can detect is 250  copies / mL. A negative result does not preclude SARS-CoV-2 infection  and should not be used as the sole basis for treatment or other  patient management decisions.  A negative result may occur with  improper specimen collection / handling, submission of specimen other  than nasopharyngeal swab, presence of viral mutation(s) within the  areas targeted by this assay, and inadequate number of viral copies  (<250 copies / mL). A negative result must be combined with clinical  observations, patient history, and epidemiological information. If result is POSITIVE SARS-CoV-2 target nucleic acids are DETECTED . The SARS-CoV-2 RNA is generally detectable in upper and lower  respiratory specimens during the acute phase of infection.  Positive  results are indicative of active infection with SARS-CoV-2.  Clinical  correlation with patient history and other diagnostic information is  necessary to determine patient infection status.  Positive results do  not rule out bacterial infection or co-infection with other viruses. If result is PRESUMPTIVE POSTIVE SARS-CoV-2 nucleic acids MAY BE PRESENT.   A  presumptive positive result was obtained on the submitted specimen  and confirmed on repeat testing.  While 2019 novel coronavirus  (SARS-CoV-2) nucleic acids may be present in the submitted sample  additional confirmatory testing may be necessary for epidemiological  and / or clinical management purposes  to differentiate between  SARS-CoV-2 and other Sarbecovirus currently known to infect humans.  If clinically indicated additional testing with an alternate test  methodology [phone] ) is advised. The SARS-CoV-2 RNA is generally  detectable in upper and lower respiratory specimens during the acute  phase of infection. The expected result is Negative. Fact Sheet for Patients:  BoilerBrush.com.cy Fact Sheet for Healthcare Providers: https://pope.com/ This test is not yet approved or cleared by the [place] FDA and has been authorized for detection and/or diagnosis of SARS-CoV-2 by FDA under an Emergency Use Authorization (EUA).  This EUA will remain in effect (meaning this test can be used) for the duration of the COVID-19 declaration under Section 564(b)(1) of the Act, 21 U.S.C. section 360bbb-3(b)(1), unless the authorization is terminated or revoked sooner. Performed at [facility], [address] [place], [place] [address]   Blood Culture (routine x 2)     Status: None (Preliminary result)   Collection Time: 04/13/19  5:57 PM  Result Value  Ref Range Status   Specimen Description   Final    BLOOD BLOOD RIGHT FOREARM Performed at [facility], [address] [place], [place] [address]    Special Requests   Final    BOTTLES DRAWN AEROBIC AND ANAEROBIC Blood Culture results may not be optimal due to an inadequate volume of blood received in culture bottles Performed at [facility], [address] [place], [place] [address]    Culture   Final    NO GROWTH 1  DAY Performed at [facility], [address] [place], [place] [address]    Report Status PENDING  Incomplete  Blood Culture (routine x 2)     Status: None (Preliminary result)   Collection Time: 04/13/19  6:28 PM  Result Value Ref Range Status   Specimen Description   Final    BLOOD LEFT ANTECUBITAL Performed at [facility], [address], [place], [place] [address]    Special Requests   Final    BOTTLES DRAWN AEROBIC AND ANAEROBIC Blood Culture results may not be optimal due to an excessive volume of blood received in culture bottles Performed at [facility], [address] [place], [place] [address]    Culture   Final    NO GROWTH 1 DAY Performed at [facility], [address] [place], [place] [address]    Report Status PENDING  Incomplete    Radiology Reports Ct Abdomen Pelvis W Contrast  Result Date: 04/13/2019 CLINICAL DATA:  58 y/o M; right lower quadrant abdominal pain, fever, cough. EXAM: CT ABDOMEN AND PELVIS WITH CONTRAST TECHNIQUE: Multidetector CT imaging of the abdomen and pelvis was performed using the standard protocol following bolus administration of intravenous contrast. CONTRAST:  OMNIPAQUE IOHEXOL 300 MG/ML  SOLN COMPARISON:  04/13/2019 chest radiograph FINDINGS: Lower chest: Patchy ground-glass opacities within the lung bases and peripheral and bronchovascular distribution. Left lower lobe calcified granuloma. Hepatobiliary: No focal liver abnormality is seen. No gallstones, gallbladder wall thickening, or biliary dilatation. Pancreas: Unremarkable. No pancreatic ductal dilatation or surrounding inflammatory changes. Spleen: Scattered calcified granulomas. Lobulated contour. Adrenals/Urinary Tract: Adrenal glands are unremarkable. Kidneys are normal, without renal calculi, focal lesion, or hydronephrosis. Bladder is unremarkable. Stomach/Bowel: Stomach is within normal limits. No evidence of bowel wall  thickening, distention, or inflammatory changes. Appendix is borderline in diameter measuring 6 mm, no secondary signs of acute appendicitis. Vascular/Lymphatic: No significant vascular findings are present. No enlarged abdominal or pelvic lymph nodes. Reproductive: Prostate is unremarkable. Other: Left inguinal hernia containing fat. Musculoskeletal: No fracture is seen. Degenerative changes of the lumbar spine with prominent facet arthropathy. IMPRESSION: 1. There are a spectrum of findings in the lungs which can be seen with acute atypical infection (as well as other non-infectious etiologies). In particular, viral pneumonia (including COVID-19) should be considered in the appropriate clinical setting. 2. No acute process of abdomen or pelvis identified. Appendix is borderline in diameter, however, there are no secondary signs of acute appendicitis and this is likely a normal variant. Critical Value/emergent results were called by telephone at the time of interpretation on 04/13/2019 at 8:40 pm to Dr. Arthor Captain , who verbally acknowledged these results. Electronically Signed   By: Mitzi Hansen M.D.   On: 04/13/2019 20:41   Dg Chest Port 1 View  Result Date: 04/13/2019 CLINICAL DATA:  He tells [place] he has had fevers and occasional vomiting x 4 days. Nonsmoker. EXAM: PORTABLE CHEST 1  VIEW COMPARISON:  None. FINDINGS: Bilateral patchy areas of airspace disease with interstitial thickening. No pleural effusion or pneumothorax. Stable cardiomediastinal silhouette. No aggressive osseous lesion. IMPRESSION: Bilateral interstitial and patchy alveolar airspace disease concerning for multilobar pneumonia versus atypical or viral pneumonia. Electronically Signed   By: Elige KoHetal  Patel   On: 04/13/2019 18:41

## 2019-04-16 LAB — FERRITIN: Ferritin: 437 ng/mL — ABNORMAL HIGH (ref 24–336)

## 2019-04-16 LAB — COMPREHENSIVE METABOLIC PANEL
ALT: 93 U/L — ABNORMAL HIGH (ref 0–44)
AST: 74 U/L — ABNORMAL HIGH (ref 15–41)
Albumin: 3.1 g/dL — ABNORMAL LOW (ref 3.5–5.0)
Alkaline Phosphatase: 77 U/L (ref 38–126)
Anion gap: 11 (ref 5–15)
BUN: 10 mg/dL (ref 6–20)
CO2: 24 mmol/L (ref 22–32)
Calcium: 8.8 mg/dL — ABNORMAL LOW (ref 8.9–10.3)
Chloride: 108 mmol/L (ref 98–111)
Creatinine, Ser: 0.84 mg/dL (ref 0.61–1.24)
GFR calc Af Amer: >60 mL/min (ref >60)
GFR calc non Af Amer: >60 mL/min (ref >60)
Glucose, Bld: 113 mg/dL — ABNORMAL HIGH (ref 70–99)
Potassium: 3.2 mmol/L — ABNORMAL LOW (ref 3.5–5.1)
Sodium: 143 mmol/L (ref 135–145)
Total Bilirubin: 0.9 mg/dL (ref 0.3–1.2)
Total Protein: 6.9 g/dL (ref 6.5–8.1)

## 2019-04-16 LAB — CBC WITH DIFFERENTIAL/PLATELET
Abs Immature Granulocytes: 0.11 10*3/uL — ABNORMAL HIGH (ref 0.00–0.07)
Basophils Absolute: 0 10*3/uL (ref 0.0–0.1)
Basophils Relative: 0 %
Eosinophils Absolute: 0.1 10*3/uL (ref 0.0–0.5)
Eosinophils Relative: 1 %
HCT: 38.4 % — ABNORMAL LOW (ref 39.0–52.0)
Hemoglobin: 12.3 g/dL — ABNORMAL LOW (ref 13.0–17.0)
Immature Granulocytes: 2 %
Lymphocytes Relative: 21 %
Lymphs Abs: 1.5 10*3/uL (ref 0.7–4.0)
MCH: 28.5 pg (ref 26.0–34.0)
MCHC: 32 g/dL (ref 30.0–36.0)
MCV: 88.9 fL (ref 80.0–100.0)
Monocytes Absolute: 0.6 10*3/uL (ref 0.1–1.0)
Monocytes Relative: 9 %
Neutro Abs: 4.8 10*3/uL (ref 1.7–7.7)
Neutrophils Relative %: 67 %
Platelets: 422 10*3/uL — ABNORMAL HIGH (ref 150–400)
RBC: 4.32 MIL/uL (ref 4.22–5.81)
RDW: 12.8 % (ref 11.5–15.5)
WBC: 7.2 10*3/uL (ref 4.0–10.5)
nRBC: 0 % (ref 0.0–0.2)

## 2019-04-16 LAB — PHOSPHORUS: Phosphorus: 3.8 mg/dL (ref 2.5–4.6)

## 2019-04-16 LAB — D-DIMER, QUANTITATIVE: D-Dimer, Quant: 1.82 ug/mL-FEU — ABNORMAL HIGH (ref 0.00–0.50)

## 2019-04-16 LAB — C-REACTIVE PROTEIN: CRP: 11.7 mg/dL — ABNORMAL HIGH (ref <1.0)

## 2019-04-16 LAB — PROCALCITONIN: Procalcitonin: 0.65 ng/mL

## 2019-04-16 MED ORDER — LOPERAMIDE HCL 2 MG PO CAPS: 4.0000 mg | ORAL_CAPSULE | Freq: Four times a day (QID) | ORAL | 0 refills | Status: AC | PRN

## 2019-04-16 MED ORDER — POTASSIUM CHLORIDE CRYS ER 20 MEQ PO TBCR
40.0000 meq | EXTENDED_RELEASE_TABLET | Freq: Once | ORAL | Status: AC
  Administered 2019-04-16: 40 meq via ORAL
  Filled 2019-04-16: qty 2

## 2019-04-16 MED ORDER — LOPERAMIDE HCL 2 MG PO CAPS
4.0000 mg | ORAL_CAPSULE | Freq: Four times a day (QID) | ORAL | Status: DC | PRN
  Administered 2019-04-16: 11:00:00 4 mg via ORAL
  Filled 2019-04-16 (×3): qty 2

## 2019-04-16 MED ORDER — POTASSIUM CHLORIDE CRYS ER 20 MEQ PO TBCR: 40.0000 meq | EXTENDED_RELEASE_TABLET | Freq: Once | ORAL | 0 refills | Status: DC

## 2019-04-16 MED FILL — POTASSIUM CHLORIDE CRYS ER: 20 | 1 days supply | Qty: 2 | Fill #0

## 2019-04-16 NOTE — Progress Notes (Addendum)
CSW working with leader Wandra Mannan and treatment team on securing hotel for patient discharge.   CSW printed [facility] Care Plan to nurse in which Apendix A needs to be completed and faxed back to CSW at [phone].   Please call with any questions at [phone].   Update: fax received, Health Dept currenlty reviewing for acceptance of hotel voucher. Will notify treatment team with further updates if patient accepted.   [place], [place] [phone]

## 2019-04-16 NOTE — Progress Notes (Signed)
CSW has set patient up with [facility] telephonic follow up appointment for May 18th at 2:30 pm.   [place], [place] [phone]

## 2019-04-16 NOTE — Progress Notes (Signed)
Received a call from the SW advised that they were here to pick up the Pt and to take him out.  I inquired on his prescriptions.  I advised that were waiting for his prescriptions and getting them.  She advised they were making arrangements and to send the Pt out.  The Pt was packed, dress and ready to go.I took the Pt out to front to be taken to the [employer] - via W/C

## 2019-04-16 NOTE — Progress Notes (Signed)
At 1500, the pt was provided with d/c instructions and information regarding COVID-19. After discussing the pt's plan of care upon d/c, he reported no further questions or concerns.

## 2019-04-16 NOTE — Progress Notes (Signed)
At 1018, Susa Raring, MD was paged regarding the pt's c/o diarrhea and request for a PRN med.

## 2019-04-16 NOTE — Progress Notes (Addendum)
CSW working with leadership Wandra Mannan on ensuring patient gets his medications prescribed at discharge.   Update: Medications will be mailed from pharmacy to patient's hotel at  [facility] [address] [place], [place] [address]  [address] [place] [phone]

## 2019-04-16 NOTE — Discharge Summary (Addendum)
Jay Lawson ZOX:[id number] DOB: 01/03/1961 DOA: 04/13/2019  PCP: Mike Gipouglas, Andre, FNP  Admit date: 04/13/2019  Discharge date: 04/16/2019  [facility]   Disposition:  Hotel per CM   Recommendations for Outpatient Follow-up:   Follow up with PCP in 1-2 weeks  PCP Please obtain BMP/CBC, 2 view CXR in 1week,  (see Discharge instructions)   PCP Please follow up on the following pending results:    Home Health: None   Equipment/Devices: None  Consultations: None Discharge Condition: Stable   CODE STATUS: Full   Diet Recommendation: Heart Healthy   Diet Order            Diet - low sodium heart healthy        DIET SOFT Room service appropriate? Yes; Fluid consistency: Thin  Diet effective now               Chief Complaint  Patient presents with   Abdominal Pain   Fever     Brief history of present illness from the day of admission and additional interim summary    Jay Drivererence Curryis a 58 y.[facility]with medical history significant ofanxiety and substance abuse (currently in remission)who presents to the emergency department with complaints of 7-8 days onset of intermittent subjective fever with nasal congestion and 4days of right lower quadrant pain.  Was diagnosed with COVID-19 infection, CT scan abdomen pelvis was nonspecific but low to no suspicion for acute appendicitis.                                                                   Hospital Course   1. Acute Covid 19 Viral Illness during the ongoing 2020 Covid 19 Pandemic - continues to stay stable, low-grade fevers, now no shortness of breath on  RA, inflammatory markers remain stable. He is symptom-free resting comfortably in the room, will be discharged with outpatient PCP and midlevel  follow-up, case management to assist with follow-ups.  Note his symptoms now are at least 2710 days old.  Does have mild diarrhea from the COVID infection for which she will receive PRN Imodium.  Abdominal exam is completely benign.  COVID-19 Labs  Recent Labs    04/14/19 0823 04/15/19 0405 04/16/19 0600  DDIMER 2.49* 2.08* 1.82*  FERRITIN 346* 403* 437*  CRP 19.4* 17.9* 11.7*    Lab Results  Component Value Date   SARSCOV2NAA POSITIVE (A) 04/13/2019     2.  Hypokalemia - replaced, PCP to recheck in 1 week.  3.  RLQ abdominal pain with nonspecific CT scan findings.  Pain has completely resolved, no right upper quadrant tenderness, no leukocytosis.  Monitor.  4. Hypophosphatemia.  Replaced and satble.   Discharge diagnosis     Principal Problem:   Real time  reverse transcriptase PCR positive for COVID-19 virus Active Problems:   Substance abuse in remission (HCC)   Opioid type dependence, abuse (HCC)   Sleep apnea   Pneumonia due to COVID-19 virus   Elevated transaminase level   Hypokalemia   Nausea and vomiting   Sepsis (HCC)   Abdominal pain    Discharge instructions    Discharge Instructions    Diet - low sodium heart healthy   Complete by:  As directed    Discharge instructions   Complete by:  As directed    Follow with Primary MD Mike Gip, FNP in 7 days   Get CBC, CMP, 2 view Chest X ray -  checked  by Primary MD in 5-7 days   Activity: As tolerated with Full fall precautions use walker/cane & assistance as needed  Disposition Home    Diet: Heart Healthy    Special Instructions: If you have smoked or chewed Tobacco  in the last 2 yrs please stop smoking, stop any regular Alcohol  and or any Recreational drug use.  On your next visit with your primary care physician please Get Medicines reviewed and adjusted.  Please request your Prim.MD to go over all Hospital Tests and Procedure/Radiological results at the follow up, please get all Hospital  records sent to your Prim MD by signing hospital release before you go home.  If you experience worsening of your admission symptoms, develop shortness of breath, life threatening emergency, suicidal or homicidal thoughts you must seek medical attention immediately by calling 911 or calling your MD immediately  if symptoms less severe.  You Must read complete instructions/literature along with all the possible adverse reactions/side effects for all the Medicines you take and that have been prescribed to you. Take any new Medicines after you have completely understood and accpet all the possible adverse reactions/side effects.   Increase activity slowly   Complete by:  As directed    MyChart COVID-19 home monitoring program   Complete by:  Apr 16, 2019    Is the patient willing to use the MyChart Mobile App for home monitoring?:  Yes   Temperature monitoring   Complete by:  Apr 16, 2019    After how many days would you like to receive a notification of this patient's flowsheet entries?:  1      Discharge Medications   Allergies as of 04/16/2019   No Known Allergies     Medication List    STOP taking these medications   gabapentin 600 MG tablet Commonly known as:  NEURONTIN   hydrOXYzine 50 MG tablet Commonly known as:  ATARAX/VISTARIL   OLANZapine 5 MG tablet Commonly known as:  ZYPREXA     TAKE these medications   acetaminophen 500 MG tablet Commonly known as:  TYLENOL Take 500 mg by mouth every 6 (six) hours as needed for mild pain, moderate pain or headache.   loperamide 2 MG capsule Commonly known as:  IMODIUM Take 2 capsules (4 mg total) by mouth every 6 (six) hours as needed for diarrhea or loose stools.   potassium chloride SA 20 MEQ tablet Commonly known as:  K-DUR Take 2 tablets (40 mEq total) by mouth once for 1 dose.       Follow-up Information    Mike Gip, FNP. Schedule an appointment as soon as possible for a visit in 1 week(s).   Specialty:   Family Medicine Contact information: [address] [place] [address] [phone]  Major procedures and Radiology Reports - PLEASE review detailed and final reports thoroughly  -       Ct Abdomen Pelvis W Contrast  Result Date: 04/13/2019 CLINICAL DATA:  59 y/o M; right lower quadrant abdominal pain, fever, cough. EXAM: CT ABDOMEN AND PELVIS WITH CONTRAST TECHNIQUE: Multidetector CT imaging of the abdomen and pelvis was performed using the standard protocol following bolus administration of intravenous contrast. CONTRAST:  OMNIPAQUE IOHEXOL 300 MG/ML  SOLN COMPARISON:  04/13/2019 chest radiograph FINDINGS: Lower chest: Patchy ground-glass opacities within the lung bases and peripheral and bronchovascular distribution. Left lower lobe calcified granuloma. Hepatobiliary: No focal liver abnormality is seen. No gallstones, gallbladder wall thickening, or biliary dilatation. Pancreas: Unremarkable. No pancreatic ductal dilatation or surrounding inflammatory changes. Spleen: Scattered calcified granulomas. Lobulated contour. Adrenals/Urinary Tract: Adrenal glands are unremarkable. Kidneys are normal, without renal calculi, focal lesion, or hydronephrosis. Bladder is unremarkable. Stomach/Bowel: Stomach is within normal limits. No evidence of bowel wall thickening, distention, or inflammatory changes. Appendix is borderline in diameter measuring 6 mm, no secondary signs of acute appendicitis. Vascular/Lymphatic: No significant vascular findings are present. No enlarged abdominal or pelvic lymph nodes. Reproductive: Prostate is unremarkable. Other: Left inguinal hernia containing fat. Musculoskeletal: No fracture is seen. Degenerative changes of the lumbar spine with prominent facet arthropathy. IMPRESSION: 1. There are a spectrum of findings in the lungs which can be seen with acute atypical infection (as well as other non-infectious etiologies). In particular, viral pneumonia  (including COVID-19) should be considered in the appropriate clinical setting. 2. No acute process of abdomen or pelvis identified. Appendix is borderline in diameter, however, there are no secondary signs of acute appendicitis and this is likely a normal variant. Critical Value/emergent results were called by telephone at the time of interpretation on 04/13/2019 at 8:40 pm to Dr. Arthor Captain , who verbally acknowledged these results. Electronically Signed   By: Mitzi Hansen M.D.   On: 04/13/2019 20:41   Dg Chest Port 1 View  Result Date: 04/13/2019 CLINICAL DATA:  He tells [place] he has had fevers and occasional vomiting x 4 days. Nonsmoker. EXAM: PORTABLE CHEST 1 VIEW COMPARISON:  None. FINDINGS: Bilateral patchy areas of airspace disease with interstitial thickening. No pleural effusion or pneumothorax. Stable cardiomediastinal silhouette. No aggressive osseous lesion. IMPRESSION: Bilateral interstitial and patchy alveolar airspace disease concerning for multilobar pneumonia versus atypical or viral pneumonia. Electronically Signed   By: Elige Ko   On: 04/13/2019 18:41    Micro Results     Recent Results (from the past 240 hour(s))  SARS Coronavirus 2 (CEPHEID- Performed in [facility] hospital lab), Hosp Order     Status: Abnormal   Collection Time: 04/13/19  5:52 PM  Result Value Ref Range Status   SARS Coronavirus 2 POSITIVE (A) NEGATIVE Final    Comment: RESULT CALLED TO, READ BACK BY AND VERIFIED WITH: GARRISON,G @ 2145 ON [id number] BY POTEAT,S (NOTE) If result is NEGATIVE SARS-CoV-2 target nucleic acids are NOT DETECTED. The SARS-CoV-2 RNA is generally detectable in upper and lower  respiratory specimens during the acute phase of infection. The lowest  concentration of SARS-CoV-2 viral copies this assay can detect is 250  copies / mL. A negative result does not preclude SARS-CoV-2 infection  and should not be used as the sole basis for treatment or other  patient management  decisions.  A negative result may occur with  improper specimen collection / handling, submission of specimen other  than nasopharyngeal swab, presence of  viral mutation(s) within the  areas targeted by this assay, and inadequate number of viral copies  (<250 copies / mL). A negative result must be combined with clinical  observations, patient history, and epidemiological information. If result is POSITIVE SARS-CoV-2 target nucleic acids are DETECTED . The SARS-CoV-2 RNA is generally detectable in upper and lower  respiratory specimens during the acute phase of infection.  Positive  results are indicative of active infection with SARS-CoV-2.  Clinical  correlation with patient history and other diagnostic information is  necessary to determine patient infection status.  Positive results do  not rule out bacterial infection or co-infection with other viruses. If result is PRESUMPTIVE POSTIVE SARS-CoV-2 nucleic acids MAY BE PRESENT.   A presumptive positive result was obtained on the submitted specimen  and confirmed on repeat testing.  While 2019 novel coronavirus  (SARS-CoV-2) nucleic acids may be present in the submitted sample  additional confirmatory testing may be necessary for epidemiological  and / or clinical management purposes  to differentiate between  SARS-CoV-2 and other Sarbecovirus currently known to infect humans.  If clinically indicated additional testing with an alternate test  methodology [phone] ) is advised. The SARS-CoV-2 RNA is generally  detectable in upper and lower respiratory specimens during the acute  phase of infection. The expected result is Negative. Fact Sheet for Patients:  BoilerBrush.com.cy Fact Sheet for Healthcare Providers: https://pope.com/ This test is not yet approved or cleared by the [place] FDA and has been authorized for detection and/or diagnosis of SARS-CoV-2 by FDA under an  Emergency Use Authorization (EUA).  This EUA will remain in effect (meaning this test can be used) for the duration of the COVID-19 declaration under Section 564(b)(1) of the Act, 21 U.S.C. section 360bbb-3(b)(1), unless the authorization is terminated or revoked sooner. Performed at [facility], [address], [place], [place] [address]   Blood Culture (routine x 2)     Status: None (Preliminary result)   Collection Time: 04/13/19  5:57 PM  Result Value Ref Range Status   Specimen Description   Final    BLOOD BLOOD RIGHT FOREARM Performed at [facility], [address] [place], [place] [address]    Special Requests   Final    BOTTLES DRAWN AEROBIC AND ANAEROBIC Blood Culture results may not be optimal due to an inadequate volume of blood received in culture bottles Performed at [facility], [address], [place], [place] [address]    Culture   Final    NO GROWTH 2 DAYS Performed at [facility], [address], [place], [place] [address]    Report Status PENDING  Incomplete  Blood Culture (routine x 2)     Status: None (Preliminary result)   Collection Time: 04/13/19  6:28 PM  Result Value Ref Range Status   Specimen Description   Final    BLOOD LEFT ANTECUBITAL Performed at [facility], [address] [place], [place] [address]    Special Requests   Final    BOTTLES DRAWN AEROBIC AND ANAEROBIC Blood Culture results may not be optimal due to an excessive volume of blood received in culture bottles Performed at [facility], [address], [place], [place] [address]    Culture   Final    NO GROWTH 2 DAYS Performed at [facility], [address] [place], [place] [address]    Report Status PENDING  Incomplete    Today  Subjective    Joshue Badal today has no headache,no chest abdominal pain,no new weakness tingling or numbness, feels much better wants  to go home today.     Objective   Blood pressure 133/73, pulse (!) 59, temperature 98.7 F (37.1 C), temperature source Oral, resp. rate 18, height  (1.778 m), weight 105.2 kg, SpO2 94 %.   Intake/Output Summary (Last 24 hours) at 04/16/2019 1019 Last data filed at 04/16/2019 0920 Gross per 24 hour  Intake 840 ml  Output --  Net 840 ml    Exam  Awake Alert, Oriented x 3, No new F.N deficits, Normal affect [place].AT,PERRAL Supple Neck,No JVD, No cervical lymphadenopathy appriciated.  Symmetrical Chest wall movement, Good air movement bilaterally, CTAB RRR,No Gallops,Rubs or new Murmurs, No Parasternal Heave +ve B.Sounds, Abd Soft, Non tender, No organomegaly appriciated, No rebound -guarding or rigidity. No Cyanosis, Clubbing or edema, No new Rash or bruise   Data Review   CBC w Diff:  Lab Results  Component Value Date   WBC 7.2 04/16/2019   HGB 12.3 (L) 04/16/2019   HGB 14.3 02/07/2019   HCT 38.4 (L) 04/16/2019   HCT 41.5 02/07/2019   PLT 422 (H) 04/16/2019   PLT 343 02/07/2019   LYMPHOPCT 21 04/16/2019   MONOPCT 9 04/16/2019   EOSPCT 1 04/16/2019   BASOPCT 0 04/16/2019    CMP:  Lab Results  Component Value Date   NA 143 04/16/2019   NA 146 (H) 02/07/2019   K 3.2 (L) 04/16/2019   CL 108 04/16/2019   CO2 24 04/16/2019   BUN 10 04/16/2019   BUN 9 02/07/2019   CREATININE 0.84 04/16/2019   PROT 6.9 04/16/2019   PROT 7.1 02/07/2019   ALBUMIN 3.1 (L) 04/16/2019   ALBUMIN 4.7 02/07/2019   BILITOT 0.9 04/16/2019   BILITOT 0.5 02/07/2019   ALKPHOS 77 04/16/2019   AST 74 (H) 04/16/2019   ALT 93 (H) 04/16/2019  .   Total Time in preparing paper work, data evaluation and todays exam - 35 minutes  Susa Raring M.D on 04/16/2019 at 10:19 AM  [facility]   Office  [phone]

## 2019-04-16 NOTE — TOC Initial Note (Signed)
Transition of Care [facility]) - Initial/Assessment Note    Patient Details  Name: Jay Lawson MRN: [id number] Date of Birth: 08-05-1961  Transition of Care [facility]) CM/SW Contact:    Gildardo Griffes, [place] Phone Number: [phone] 04/16/2019, 2:49 PM  Clinical Narrative:                  CSW consulted with patient regarding discharge planning. Patient agrees to go to [facility] which is being provided by [facility] Health in order to isolate. It is recommended patient isolate at this time until safe to return to [facility] CSW has consulted with [facility] house who report they have no way to isolate patient at the group home and require patient to be non contagious before returning. Patient reports understanding of isolation order while at hotel and has been given bill of rights documents from Health Dept. CSW also explained meals will be delivered to patient in room. No further needs at this time.   Expected Discharge Plan: Group Home Barriers to Discharge: No Barriers Identified   Patient Goals and CMS Choice   CMS Medicare.gov Compare Post Acute Care list provided to:: Patient Choice offered to / list presented to : Patient  Expected Discharge Plan and Services Expected Discharge Plan: Group Home   Discharge Planning Services: NA Post Acute Care Choice: NA Living arrangements for the past 2 months: Group Home Expected Discharge Date: 04/16/19               DME Arranged: N/A DME Agency: NA         HH Agency: NA        Prior Living Arrangements/Services Living arrangements for the past 2 months: Group Home Lives with:: Facility Resident Patient language and need for interpreter reviewed:: Yes Do you feel safe going back to the place where you live?: No   patient will need to isolate before returning to [facility]  Need for Family Participation in Patient Care: No (Comment) Care giver support system in place?: Yes (comment)   Criminal Activity/Legal Involvement  Pertinent to Current Situation/Hospitalization: No - Comment as needed  Activities of Daily Living Home Assistive Devices/Equipment: None ADL Screening (condition at time of admission) Patient's cognitive ability adequate to safely complete daily activities?: Yes Is the patient deaf or have difficulty hearing?: No Does the patient have difficulty seeing, even when wearing glasses/contacts?: No Does the patient have difficulty concentrating, remembering, or making decisions?: No Patient able to express need for assistance with ADLs?: Yes Does the patient have difficulty dressing or bathing?: No Independently performs ADLs?: Yes (appropriate for developmental age) Does the patient have difficulty walking or climbing stairs?: No Weakness of Legs: None Weakness of Arms/Hands: None  Permission Sought/Granted Permission sought to share information with : Case Manager, Oceanographer granted to share information with : Yes, Verbal Permission Granted     Permission granted to share info w AGENCY: [facility]        Emotional Assessment Appearance:: Appears stated age Attitude/Demeanor/Rapport: Unable to Assess Affect (typically observed): Unable to Assess Orientation: : Oriented to Self, Oriented to Place, Oriented to Situation, Oriented to  Time Alcohol / Substance Use: Not Applicable Psych Involvement: No (comment)  Admission diagnosis:  COVID-19 virus infection [U07.1] Real time reverse transcriptase PCR positive for COVID-19 virus [U07.1] Patient Active Problem List   Diagnosis Date Noted  . Pneumonia due to COVID-19 virus 04/14/2019  . Elevated transaminase level 04/14/2019  . Hypokalemia 04/14/2019  .  Nausea and vomiting 04/14/2019  . Sepsis (HCC) 04/14/2019  . Abdominal pain 04/14/2019  . Real time reverse transcriptase PCR positive for COVID-19 virus 04/13/2019  . Allergic rhinitis 02/20/2019  . Breast mass, right 02/20/2019  . Substance  abuse in remission (HCC) 02/07/2019  . Opioid type dependence, abuse (HCC) 10/31/2004  . Sleep apnea 12/11/1997   PCP:  Mike Gipouglas, Andre, FNP Pharmacy:   [facility] [address] - [place], [place] - [address] [place] [address] Phone: [phone] Fax: [phone]  [facility] [place] - [address] [place] [address] Phone: [phone] Fax: [phone]     Social Determinants of Health (SDOH) Interventions    Readmission Risk Interventions No flowsheet data found.

## 2019-04-16 NOTE — TOC Transition Note (Signed)
Transition of Care [facility]) - CM/SW Discharge Note   Patient Details  Name: Jay Lawson MRN: [id number] Date of Birth: 1961-04-04  [facility]) CM/SW Contact:  Gildardo Griffes, [place] Phone Number: [phone] 04/16/2019, 2:53 PM   Clinical Narrative:     Patient will DC to: [facility] Anticipated DC date: 04/16/2019 Family notified: none identified, patient will contact friend in chart contacts Transport by: Health Dept Zenaida Niece  Per MD patient ready for DC to [facility] hotel for continued isolation due to COVID before returning to [facility] house . RN and patient notified of DC. Patient will be in room 145 at [facility] and reports understanding isolation order. CSW explained meals will be brought and delivered to patient while at hotel. No further needs expressed at this time, patient hopeful of returning to [facility] house after isolation period.  CSW signing off.  [place], [place] [phone]   Final next level of care: ([facility] hotel for isolation) Barriers to Discharge: No Barriers Identified   Patient Goals and CMS Choice Patient states their goals for this hospitalization and ongoing recovery are:: to return to [facility] after isolation CMS Medicare.gov Compare Post Acute Care list provided to:: Patient Choice offered to / list presented to : Patient  Discharge Placement              Patient chooses bed at: [facility] hotel for isolation) Patient to be transferred to facility by: Health Dept Zenaida Niece Name of family member notified: N/A    Discharge Plan and Services   Discharge Planning Services: NA Post Acute Care Choice: NA          DME Arranged: N/A DME Agency: NA       HH Arranged: NA HH Agency: NA        Social Determinants of Health (SDOH) Interventions     Readmission Risk Interventions No flowsheet data found.

## 2019-04-16 NOTE — Discharge Instructions (Signed)
Follow with Primary MD Mike Gip, FNP in 7 days   Get CBC, CMP, 2 view Chest X ray -  checked  by Primary MD in 5-7 days   Activity: As tolerated with Full fall precautions use walker/cane & assistance as needed  Disposition Home    Diet: Heart Healthy    Special Instructions: If you have smoked or chewed Tobacco  in the last 2 yrs please stop smoking, stop any regular Alcohol  and or any Recreational drug use.  On your next visit with your primary care physician please Get Medicines reviewed and adjusted.  Please request your Prim.MD to go over all Hospital Tests and Procedure/Radiological results at the follow up, please get all Hospital records sent to your Prim MD by signing hospital release before you go home.  If you experience worsening of your admission symptoms, develop shortness of breath, life threatening emergency, suicidal or homicidal thoughts you must seek medical attention immediately by calling 911 or calling your MD immediately  if symptoms less severe.  You Must read complete instructions/literature along with all the possible adverse reactions/side effects for all the Medicines you take and that have been prescribed to you. Take any new Medicines after you have completely understood and accpet all the possible adverse reactions/side effects.        Person Under Monitoring Name: Jay Lawson  Location: [address] [place] [address]   Infection Prevention Recommendations for Individuals Confirmed to have, or Being Evaluated for, 2019 Novel Coronavirus (COVID-19) Infection Who Receive Care at Home  Individuals who are confirmed to have, or are being evaluated for, COVID-19 should follow the prevention steps below until a healthcare provider or local or state health department says they can return to normal activities.  Stay home except to get medical care You should restrict activities outside your home, except for getting medical care. Do not go to  work, school, or public areas, and do not use public transportation or taxis.  Call ahead before visiting your doctor Before your medical appointment, call the healthcare provider and tell them that you have, or are being evaluated for, COVID-19 infection. This will help the healthcare providers office take steps to keep other people from getting infected. Ask your healthcare provider to call the local or state health department.  Monitor your symptoms Seek prompt medical attention if your illness is worsening (e.g., difficulty breathing). Before going to your medical appointment, call the healthcare provider and tell them that you have, or are being evaluated for, COVID-19 infection. Ask your healthcare provider to call the local or state health department.  Wear a facemask You should wear a facemask that covers your nose and mouth when you are in the same room with other people and when you visit a healthcare provider. People who live with or visit you should also wear a facemask while they are in the same room with you.  Separate yourself from other people in your home As much as possible, you should stay in a different room from other people in your home. Also, you should use a separate bathroom, if available.  Avoid sharing household items You should not share dishes, drinking glasses, cups, eating utensils, towels, bedding, or other items with other people in your home. After using these items, you should wash them thoroughly with soap and water.  Cover your coughs and sneezes Cover your mouth and nose with a tissue when you cough or sneeze, or you can cough or sneeze into  your sleeve. Throw used tissues in a lined trash can, and immediately wash your hands with soap and water for at least 20 seconds or use an alcohol-based hand rub.  Wash your [facility] your hands often and thoroughly with soap and water for at least 20 seconds. You can use an alcohol-based hand sanitizer if  soap and water are not available and if your hands are not visibly dirty. Avoid touching your eyes, nose, and mouth with unwashed hands.   Prevention Steps for Caregivers and Household Members of Individuals Confirmed to have, or Being Evaluated for, COVID-19 Infection Being Cared for in the Home  If you live with, or provide care at home for, a person confirmed to have, or being evaluated for, COVID-19 infection please follow these guidelines to prevent infection:  Follow healthcare providers instructions Make sure that you understand and can help the patient follow any healthcare provider instructions for all care.  Provide for the patients basic needs You should help the patient with basic needs in the home and provide support for getting groceries, prescriptions, and other personal needs.  Monitor the patients symptoms If they are getting sicker, call his or her medical provider and tell them that the patient has, or is being evaluated for, COVID-19 infection. This will help the healthcare providers office take steps to keep other people from getting infected. Ask the healthcare provider to call the local or state health department.  Limit the number of people who have contact with the patient  If possible, have only one caregiver for the patient.  Other household members should stay in another home or place of residence. If this is not possible, they should stay  in another room, or be separated from the patient as much as possible. Use a separate bathroom, if available.  Restrict visitors who do not have an essential need to be in the home.  Keep older adults, very young children, and other sick people away from the patient Keep older adults, very young children, and those who have compromised immune systems or chronic health conditions away from the patient. This includes people with chronic heart, lung, or kidney conditions, diabetes, and cancer.  Ensure good  ventilation Make sure that shared spaces in the home have good air flow, such as from an air conditioner or an opened window, weather permitting.  Wash your hands often  Wash your hands often and thoroughly with soap and water for at least 20 seconds. You can use an alcohol based hand sanitizer if soap and water are not available and if your hands are not visibly dirty.  Avoid touching your eyes, nose, and mouth with unwashed hands.  Use disposable paper towels to dry your hands. If not available, use dedicated cloth towels and replace them when they become wet.  Wear a facemask and gloves  Wear a disposable facemask at all times in the room and gloves when you touch or have contact with the patients blood, body fluids, and/or secretions or excretions, such as sweat, saliva, sputum, nasal mucus, vomit, urine, or feces.  Ensure the mask fits over your nose and mouth tightly, and do not touch it during use.  Throw out disposable facemasks and gloves after using them. Do not reuse.  Wash your hands immediately after removing your facemask and gloves.  If your personal clothing becomes contaminated, carefully remove clothing and launder. Wash your hands after handling contaminated clothing.  Place all used disposable facemasks, gloves, and other waste in a  lined container before disposing them with other household waste.  Remove gloves and wash your hands immediately after handling these items.  Do not share dishes, glasses, or other household items with the patient  Avoid sharing household items. You should not share dishes, drinking glasses, cups, eating utensils, towels, bedding, or other items with a patient who is confirmed to have, or being evaluated for, COVID-19 infection.  After the person uses these items, you should wash them thoroughly with soap and water.  Wash laundry thoroughly  Immediately remove and wash clothes or bedding that have blood, body fluids, and/or  secretions or excretions, such as sweat, saliva, sputum, nasal mucus, vomit, urine, or feces, on them.  Wear gloves when handling laundry from the patient.  Read and follow directions on labels of laundry or clothing items and detergent. In general, wash and dry with the warmest temperatures recommended on the label.  Clean all areas the individual has used often  Clean all touchable surfaces, such as counters, tabletops, doorknobs, bathroom fixtures, toilets, phones, keyboards, tablets, and bedside tables, every day. Also, clean any surfaces that may have blood, body fluids, and/or secretions or excretions on them.  Wear gloves when cleaning surfaces the patient has come in contact with.  Use a diluted bleach solution (e.g., dilute bleach with 1 part bleach and 10 parts water) or a household disinfectant with a label that says EPA-registered for coronaviruses. To make a bleach solution at home, add 1 tablespoon of bleach to 1 quart (4 cups) of water. For a larger supply, add  cup of bleach to 1 gallon (16 cups) of water.  Read labels of cleaning products and follow recommendations provided on product labels. Labels contain instructions for safe and effective use of the cleaning product including precautions you should take when applying the product, such as wearing gloves or eye protection and making sure you have good ventilation during use of the product.  Remove gloves and wash hands immediately after cleaning.  Monitor yourself for signs and symptoms of illness Caregivers and household members are considered close contacts, should monitor their health, and will be asked to limit movement outside of the home to the extent possible. Follow the monitoring steps for close contacts listed on the symptom monitoring form.   ? If you have additional questions, contact your local health department or call the epidemiologist on call at [phone] (available 24/7). ? This guidance is subject  to change. For the most up-to-date guidance from [facility], please refer to their website: TripMetro.huhttps://www.cdc.gov/coronavirus/2019-ncov/hcp/guidance-prevent-spread.html

## 2019-04-17 ENCOUNTER — Telehealth (INDEPENDENT_AMBULATORY_CARE_PROVIDER_SITE_OTHER): Payer: Self-pay | Admitting: Family Medicine

## 2019-04-17 NOTE — Telephone Encounter (Signed)
Pt has declined using MyChart.

## 2019-04-20 LAB — CULTURE, BLOOD (ROUTINE X 2)
Culture: NO GROWTH
Culture: NO GROWTH

## 2019-04-28 ENCOUNTER — Inpatient Hospital Stay: Payer: Self-pay | Admitting: Family Medicine

## 2019-04-30 ENCOUNTER — Telehealth: Payer: Self-pay

## 2019-04-30 NOTE — Telephone Encounter (Signed)
Tried to call patient for appointment tomorrow. No answer, no voicemail. Patient has a positive diagnosis of COVID-19 and needs to changed to [facility] visit.

## 2019-05-01 ENCOUNTER — Ambulatory Visit (INDEPENDENT_AMBULATORY_CARE_PROVIDER_SITE_OTHER): Payer: Self-pay | Admitting: Family Medicine

## 2019-05-01 ENCOUNTER — Other Ambulatory Visit: Payer: Self-pay

## 2019-05-01 ENCOUNTER — Encounter: Payer: Self-pay | Admitting: Family Medicine

## 2019-05-01 DIAGNOSIS — U071 COVID-19: Secondary | ICD-10-CM

## 2019-05-01 DIAGNOSIS — J1282 Pneumonia due to coronavirus disease 2019: Secondary | ICD-10-CM

## 2019-05-01 DIAGNOSIS — J1289 Other viral pneumonia: Secondary | ICD-10-CM

## 2019-05-01 DIAGNOSIS — E876 Hypokalemia: Secondary | ICD-10-CM

## 2019-05-01 NOTE — Progress Notes (Signed)
  [facility]  Virtual Visit via Telephone Note  I connected with Benson Norway on 05/01/19 at 11:00 AM EDT by telephone and verified that I am speaking with the correct person using two identifiers.   I discussed the limitations, risks, security and privacy concerns of performing an evaluation and management service by telephone and the availability of in person appointments. I also discussed with the patient that there may be a patient responsible charge related to this service. The patient expressed understanding and agreed to proceed.   History of Present Illness: Jay Lawson  has a past medical history of Anxiety. Patient recently hospitalized due to COVID-19 virus infection.  He has been in quarantine for the last 14 days.  He is at a rehab facility called [facility] and was quarantined in a hotel.  He states that he is feeling much better now.  He denies chest pain shortness of breath, dizziness, or leg swelling.  He denies fevers chills or night sweats.  He states that he was taken off of his medications for mental health while in the hospital and is doing fine without them.  He does not want to continue his mental health medications at the present time.   Observations/Objective: Patient with regular voice tone, rate and rhythm. Speaking calmly and is in no apparent distress.    Assessment and Plan: 1. COVID-19 virus detected - DG Chest 2 View; Future  2. Pneumonia due to COVID-19 virus - DG Chest 2 View; Future  3. Hypokalemia - CBC with Differential; Future - Comprehensive metabolic panel; Future  Chest x-ray ordered.  We will continue to monitor this month status and adjust medicines accordingly.   Follow Up Instructions:  We discussed hand washing, using hand sanitizer when soap and water are not available, only going out when absolutely necessary, and social distancing. Explained to patient that he is immunocompromised  and will need to take precautions during this time.   I discussed the assessment and treatment plan with the patient. The patient was provided an opportunity to ask questions and all were answered. The patient agreed with the plan and demonstrated an understanding of the instructions.   The patient was advised to call back or seek an in-person evaluation if the symptoms worsen or if the condition fails to improve as anticipated.  I provided 10 minutes of non-face-to-face time during this encounter.  Ms. Andr L. Riley Lam, FNP-BC [facility] [address] [place], [place] [address] [phone]

## 2019-05-01 NOTE — Patient Instructions (Signed)
This information is directly available on the CDC website: https://www.cdc.gov/coronavirus/2019-ncov/if-you-are-sick/steps-when-sick.html    Source:CDC Reference to specific commercial products, manufacturers, companies, or trademarks does not constitute its endorsement or recommendation by the U.S. Government, Department of Health and Human Services, or Centers for Disease Control and Prevention.  

## 2019-05-08 ENCOUNTER — Ambulatory Visit (HOSPITAL_COMMUNITY)
Admission: RE | Admit: 2019-05-08 | Discharge: 2019-05-08 | Disposition: A | Discharge status: Home / Self Care | Payer: HRSA Program | Source: Ambulatory Visit | Attending: Family Medicine | Admitting: Family Medicine

## 2019-05-08 ENCOUNTER — Other Ambulatory Visit: Payer: Self-pay

## 2019-05-08 DIAGNOSIS — U071 COVID-19: Secondary | ICD-10-CM

## 2019-05-08 DIAGNOSIS — J1282 Pneumonia due to coronavirus disease 2019: Secondary | ICD-10-CM

## 2019-05-08 DIAGNOSIS — J1289 Other viral pneumonia: Secondary | ICD-10-CM | POA: Insufficient documentation

## 2019-05-08 DIAGNOSIS — E876 Hypokalemia: Secondary | ICD-10-CM

## 2019-05-09 ENCOUNTER — Telehealth: Payer: Self-pay

## 2019-05-09 LAB — COMPREHENSIVE METABOLIC PANEL
ALT: 28 IU/L (ref 0–44)
AST: 13 IU/L (ref 0–40)
Albumin/Globulin Ratio: 2 (ref 1.2–2.2)
Albumin: 4.5 g/dL (ref 3.8–4.9)
Alkaline Phosphatase: 92 IU/L (ref 39–117)
BUN/Creatinine Ratio: 13 (ref 9–20)
BUN: 13 mg/dL (ref 6–24)
Bilirubin Total: 0.6 mg/dL (ref 0.0–1.2)
CO2: 24 mmol/L (ref 20–29)
Calcium: 10.1 mg/dL (ref 8.7–10.2)
Chloride: 101 mmol/L (ref 96–106)
Creatinine, Ser: 1.01 mg/dL (ref 0.76–1.27)
GFR calc Af Amer: 96 mL/min/{1.73_m2} (ref >59)
GFR calc non Af Amer: 83 mL/min/{1.73_m2} (ref >59)
Globulin, Total: 2.3 g/dL (ref 1.5–4.5)
Glucose: 93 mg/dL (ref 65–99)
Potassium: 4.3 mmol/L (ref 3.5–5.2)
Sodium: 140 mmol/L (ref 134–144)
Total Protein: 6.8 g/dL (ref 6.0–8.5)

## 2019-05-09 LAB — CBC WITH DIFFERENTIAL/PLATELET
Basophils Absolute: 0.1 10*3/uL (ref 0.0–0.2)
Basos: 1 %
EOS (ABSOLUTE): 0.2 10*3/uL (ref 0.0–0.4)
Eos: 2 %
Hematocrit: 44.3 % (ref 37.5–51.0)
Hemoglobin: 14.6 g/dL (ref 13.0–17.7)
Immature Grans (Abs): 0.1 10*3/uL (ref 0.0–0.1)
Immature Granulocytes: 1 %
Lymphocytes Absolute: 2.4 10*3/uL (ref 0.7–3.1)
Lymphs: 29 %
MCH: 29.2 pg (ref 26.6–33.0)
MCHC: 33 g/dL (ref 31.5–35.7)
MCV: 89 fL (ref 79–97)
Monocytes Absolute: 0.8 10*3/uL (ref 0.1–0.9)
Monocytes: 9 %
Neutrophils Absolute: 4.8 10*3/uL (ref 1.4–7.0)
Neutrophils: 58 %
Platelets: 246 10*3/uL (ref 150–450)
RBC: 5 x10E6/uL (ref 4.14–5.80)
RDW: 14.5 % (ref 11.6–15.4)
WBC: 8.2 10*3/uL (ref 3.4–10.8)

## 2019-05-09 NOTE — Progress Notes (Signed)
Your labs are stable. Chest xray shows improvement. We will continue to monitor this. Continue with your current medications. Please remember to keep your follow up appointment. If you have problems, questions or concerns, please make an appointment to discuss. Thanks!

## 2019-05-09 NOTE — Telephone Encounter (Signed)
-----   Message from Mike Gip, FNP sent at 05/09/2019  8:33 AM EDT ----- Your labs are stable. Chest xray shows improvement. We will continue to monitor this. Continue with your current medications. Please remember to keep your follow up appointment. If you have problems, questions or concerns, please make an appointment to discuss. Thanks!

## 2019-05-09 NOTE — Telephone Encounter (Signed)
Called, Patient not available. Left a message for him to call back. Thanks!

## 2019-05-09 NOTE — Telephone Encounter (Signed)
Called and spoke with patient. Advised that labs are stable and that chest xray shows improvement. Asked that he continue all current medications and keep next scheduled appointment. Thanks!

## 2019-05-13 ENCOUNTER — Telehealth: Payer: Self-pay | Admitting: *Deleted

## 2019-05-13 NOTE — Telephone Encounter (Signed)
I called pt.   He is a resident at [facility].   The gentleman that answered gave me his work number [phone] and that I could reach the pt there.  I called pt at his work number listed above.  He came to the phone and I explained that I was calling in regards to him donating plasma to assist in the recovery of other people with the COVID-19 if he was willing. He said he would like to donate plasma.   He was familiar with the process because he has given plasma in the past.  I gave him the One[employer].org web site with instructions on what to do.   Once he sent in the application someone would call him to set up the donation.  I thanked him for his willingness to donate.

## 2019-08-01 ENCOUNTER — Ambulatory Visit (INDEPENDENT_AMBULATORY_CARE_PROVIDER_SITE_OTHER): Payer: HRSA Program | Admitting: Family Medicine

## 2019-08-01 ENCOUNTER — Other Ambulatory Visit: Payer: Self-pay

## 2019-08-01 ENCOUNTER — Ambulatory Visit: Payer: Self-pay | Admitting: Family Medicine

## 2019-08-01 ENCOUNTER — Encounter: Payer: Self-pay | Admitting: Family Medicine

## 2019-08-01 VITALS — BP 108/70 | HR 68 | Temp 98.6°F | Resp 16 | Ht 70.0 in | Wt 224.0 lb

## 2019-08-01 DIAGNOSIS — J301 Allergic rhinitis due to pollen: Secondary | ICD-10-CM

## 2019-08-01 DIAGNOSIS — M2550 Pain in unspecified joint: Secondary | ICD-10-CM

## 2019-08-01 MED ORDER — IBUPROFEN 600 MG PO TABS: 600.0000 mg | ORAL_TABLET | Freq: Three times a day (TID) | ORAL | 0 refills | Status: DC | PRN

## 2019-08-01 MED ORDER — LEVOCETIRIZINE DIHYDROCHLORIDE 5 MG PO TABS: 5.0000 mg | ORAL_TABLET | Freq: Every evening | ORAL | 2 refills | Status: AC

## 2019-08-01 NOTE — Patient Instructions (Signed)
Joint Pain  Joint pain can be caused by many things. It is likely to go away if you follow instructions from your doctor for taking care of yourself at home. Sometimes, you may need more treatment. Follow these instructions at home: Managing pain, stiffness, and swelling   If told, put ice on the painful area. ? Put ice in a plastic bag. ? Place a towel between your skin and the bag. ? Leave the ice on for 20 minutes, 2-3 times a day.  If told, put heat on the painful area. Do this as often as told by your doctor. Use the heat source that your doctor recommends, such as a moist heat pack or a heating pad. ? Place a towel between your skin and the heat source. ? Leave the heat on for 20-30 minutes. ? Take off the heat if your skin gets bright red. This is especially important if you are unable to feel pain, heat, or cold. You may have a greater risk of getting burned.  Move your fingers or toes below the painful joint often. This helps with stiffness and swelling.  If possible, raise (elevate) the painful joint above the level of your heart while you are sitting or lying down. To do this, try putting a few pillows under the painful joint. Activity  Rest the painful joint for as long as told. Do not do things that cause pain or make your pain worse.  Begin exercising or stretching the affected area, as told by your doctor. Ask your doctor what types of exercise are safe for you. If you have an elastic bandage, sling, or splint:  Wear the device as told by your doctor. Take it off only as told by your doctor.  Loosen the device if your fingers or toes below the joint: ? Tingle. ? Lose feeling (get numb). ? Get cold and blue.  Keep the device clean.  Ask your doctor if you should take off the device before bathing. You may need to cover it with a watertight covering when you take a bath or a shower. General instructions  Take over-the-counter and prescription medicines only as told  by your doctor.  Do not use any products that contain nicotine or tobacco. These include cigarettes and e-cigarettes. If you need help quitting, ask your doctor.  Keep all follow-up visits as told by your doctor. This is important. Contact a doctor if:  You have pain that gets worse and does not get better with medicine.  Your joint pain does not get better in 3 days.  You have more bruising or swelling.  You have a fever.  You lose 10 lb (4.5 kg) or more without trying. Get help right away if:  You cannot move the joint.  Your fingers or toes tingle, lose feeling, or get cold and blue.  You have a fever along with a joint that is red, warm, and swollen. Summary  Joint pain can be caused by many things. It often goes away if you follow instructions from your doctor for taking care of yourself at home.  Rest the painful joint for as long as told. Do not do things that cause pain or make your pain worse.  Take over-the-counter and prescription medicines only as told by your doctor. This information is not intended to replace advice given to you by your health care provider. Make sure you discuss any questions you have with your health care provider. Document Released: 11/15/2009 Document Revised: 11/09/2017 Document   Reviewed: 09/12/2017 Elsevier Patient Education  2020 Elsevier Inc.  

## 2019-08-01 NOTE — Progress Notes (Signed)
[facility]   Progress Note: General Provider: Lanae Boast, FNP  SUBJECTIVE:   Jay Lawson is a 58 y.o. male who  has a past medical history of Anxiety.. Patient presents today for Follow-up (covid follow up ) and Joint Pain (pain in arms and leg joints) Patient states that he has had increased joint pain since recovering from [place]. Patient states that he has noticed that his upper and lower extremities have been swollen by the end of the day. He stopped taking all medications. No reason given.   Review of Systems  Constitutional: Negative.   HENT: Negative.   Eyes: Negative.   Respiratory: Negative.   Cardiovascular: Negative.   Gastrointestinal: Negative.   Genitourinary: Negative.   Musculoskeletal: Positive for joint pain.  Skin: Negative.   Neurological: Negative.   Psychiatric/Behavioral: Negative.      OBJECTIVE: BP 108/70 (BP Location: Left Arm, Patient Position: Sitting, Cuff Size: Large)   Pulse 68   Temp 98.6 F (37 C) (Oral)   Resp 16   Ht 5\' 10"  (1.778 m)   Wt 224 lb (101.6 kg)   SpO2 100%   BMI 32.14 kg/m   Wt Readings from Last 3 Encounters:  08/01/19 224 lb (101.6 kg)  04/14/19 232 lb (105.2 kg)  04/05/19 230 lb (104.3 kg)     Physical Exam Vitals signs and nursing note reviewed.  Constitutional:      General: He is not in acute distress.    Appearance: Normal appearance.  HENT:     Head: Normocephalic and atraumatic.  Eyes:     Extraocular Movements: Extraocular movements intact.     Conjunctiva/sclera: Conjunctivae normal.     Pupils: Pupils are equal, round, and reactive to light.  Cardiovascular:     Rate and Rhythm: Normal rate and regular rhythm.     Heart sounds: No murmur.  Pulmonary:     Effort: Pulmonary effort is normal.     Breath sounds: Normal breath sounds.  Musculoskeletal: Normal range of motion.     Right lower leg: Edema (mild and non-pitting. ) present.     Left  lower leg: Edema present.  Skin:    General: Skin is warm and dry.  Neurological:     Mental Status: He is alert and oriented to person, place, and time.  Psychiatric:        Mood and Affect: Mood normal.        Behavior: Behavior normal.        Thought Content: Thought content normal.        Judgment: Judgment normal.     ASSESSMENT/PLAN:  1. Arthralgia, unspecified joint - Rheumatoid Arthritis Profile - CBC with Differential - Comprehensive metabolic panel - ibuprofen (ADVIL) 600 MG tablet; Take 1 tablet (600 mg total) by mouth every 8 (eight) hours as needed.  Dispense: 30 tablet; Refill: 0  2. Non-seasonal allergic rhinitis due to pollen - levocetirizine (XYZAL) 5 MG tablet; Take 1 tablet (5 mg total) by mouth every evening.  Dispense: 30 tablet; Refill: 2    Return in about 6 months (around 02/01/2020).    The patient was given clear instructions to go to ER or return to medical center if symptoms do not improve, worsen or new problems develop. The patient verbalized understanding and agreed with plan of care.   Ms. Doug Sou. Nathaneil Canary, FNP-BC [facility] [address] [place], [place] [address] [phone]

## 2019-08-04 ENCOUNTER — Telehealth: Payer: Self-pay

## 2019-08-04 LAB — COMPREHENSIVE METABOLIC PANEL
ALT: 21 IU/L (ref 0–44)
AST: 15 IU/L (ref 0–40)
Albumin/Globulin Ratio: 2.2 (ref 1.2–2.2)
Albumin: 4.9 g/dL (ref 3.8–4.9)
Alkaline Phosphatase: 92 IU/L (ref 39–117)
BUN/Creatinine Ratio: 13 (ref 9–20)
BUN: 18 mg/dL (ref 6–24)
Bilirubin Total: 1 mg/dL (ref 0.0–1.2)
CO2: 23 mmol/L (ref 20–29)
Calcium: 9.9 mg/dL (ref 8.7–10.2)
Chloride: 101 mmol/L (ref 96–106)
Creatinine, Ser: 1.36 mg/dL — ABNORMAL HIGH (ref 0.76–1.27)
GFR calc Af Amer: 67 mL/min/{1.73_m2} (ref >59)
GFR calc non Af Amer: 58 mL/min/{1.73_m2} — ABNORMAL LOW (ref >59)
Globulin, Total: 2.2 g/dL (ref 1.5–4.5)
Glucose: 100 mg/dL — ABNORMAL HIGH (ref 65–99)
Potassium: 3.9 mmol/L (ref 3.5–5.2)
Sodium: 142 mmol/L (ref 134–144)
Total Protein: 7.1 g/dL (ref 6.0–8.5)

## 2019-08-04 LAB — CBC WITH DIFFERENTIAL/PLATELET
Basophils Absolute: 0.1 10*3/uL (ref 0.0–0.2)
Basos: 1 %
EOS (ABSOLUTE): 0.1 10*3/uL (ref 0.0–0.4)
Eos: 1 %
Hematocrit: 43.6 % (ref 37.5–51.0)
Hemoglobin: 14.9 g/dL (ref 13.0–17.7)
Immature Grans (Abs): 0 10*3/uL (ref 0.0–0.1)
Immature Granulocytes: 0 %
Lymphocytes Absolute: 2.1 10*3/uL (ref 0.7–3.1)
Lymphs: 31 %
MCH: 29.7 pg (ref 26.6–33.0)
MCHC: 34.2 g/dL (ref 31.5–35.7)
MCV: 87 fL (ref 79–97)
Monocytes Absolute: 0.6 10*3/uL (ref 0.1–0.9)
Monocytes: 8 %
Neutrophils Absolute: 4.1 10*3/uL (ref 1.4–7.0)
Neutrophils: 59 %
Platelets: 276 10*3/uL (ref 150–450)
RBC: 5.02 x10E6/uL (ref 4.14–5.80)
RDW: 14.5 % (ref 11.6–15.4)
WBC: 6.9 10*3/uL (ref 3.4–10.8)

## 2019-08-04 LAB — RHEUMATOID ARTHRITIS PROFILE
Cyclic Citrullin Peptide Ab: 5 units (ref 0–19)
Rheumatoid fact SerPl-aCnc: 10 IU/mL (ref 0.0–13.9)

## 2019-08-04 NOTE — Telephone Encounter (Signed)
-----   Message from Lanae Boast, [place] sent at 08/04/2019  9:41 AM EDT ----- Your labs are stable. Continue with your current medications. Please remember to keep your follow up appointment. If you have problems, questions or concerns, please make an appointment to discuss. Thanks!

## 2019-08-04 NOTE — Telephone Encounter (Signed)
Called, Patient not available at this time. Patient lives in a group home and a message was left with the office for him to call back. Thanks!

## 2019-08-04 NOTE — Telephone Encounter (Signed)
Patient called while I was in a room and provided a new number [phone].  I called this number at 10:57am, no answer. Left a message on voicemail to call back. Thanks!

## 2019-08-04 NOTE — Progress Notes (Signed)
Your labs are stable. Continue with your current medications. Please remember to keep your follow up appointment. If you have problems, questions or concerns, please make an appointment to discuss. Thanks!

## 2019-08-20 ENCOUNTER — Encounter (HOSPITAL_COMMUNITY): Payer: Self-pay

## 2019-08-20 ENCOUNTER — Encounter (HOSPITAL_COMMUNITY): Payer: Self-pay | Admitting: *Deleted

## 2019-09-02 ENCOUNTER — Other Ambulatory Visit: Payer: Self-pay | Admitting: Family Medicine

## 2019-09-02 DIAGNOSIS — M2550 Pain in unspecified joint: Secondary | ICD-10-CM

## 2019-11-12 IMAGING — CR CHEST - 2 VIEW
2 series · 2 of 2 positions shown · non-contrast
Comparison: 04/13/2019.

CLINICAL DATA: Follow-up pneumonia.

EXAM:
CHEST - 2 VIEW

[w chest pa]
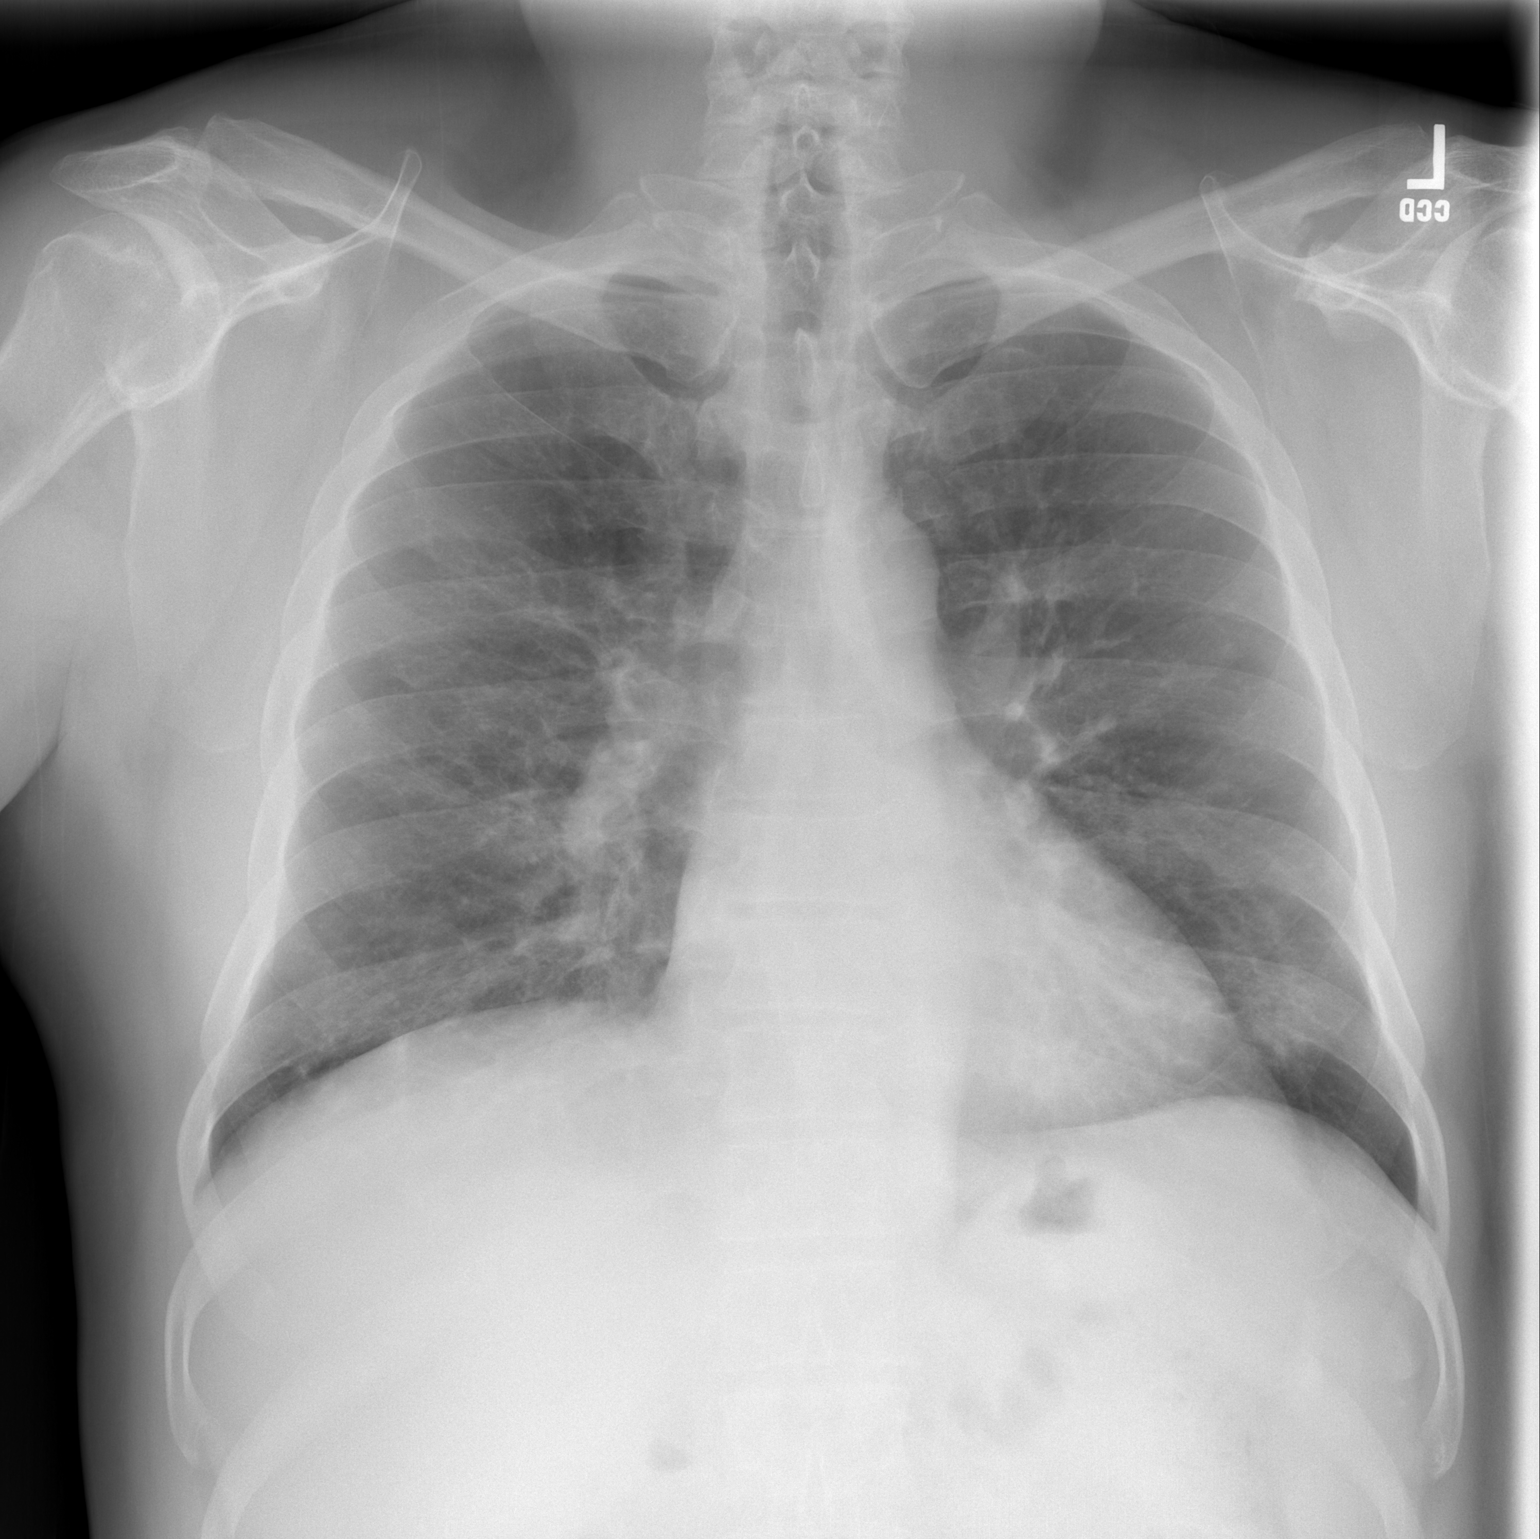

[w chest lat]
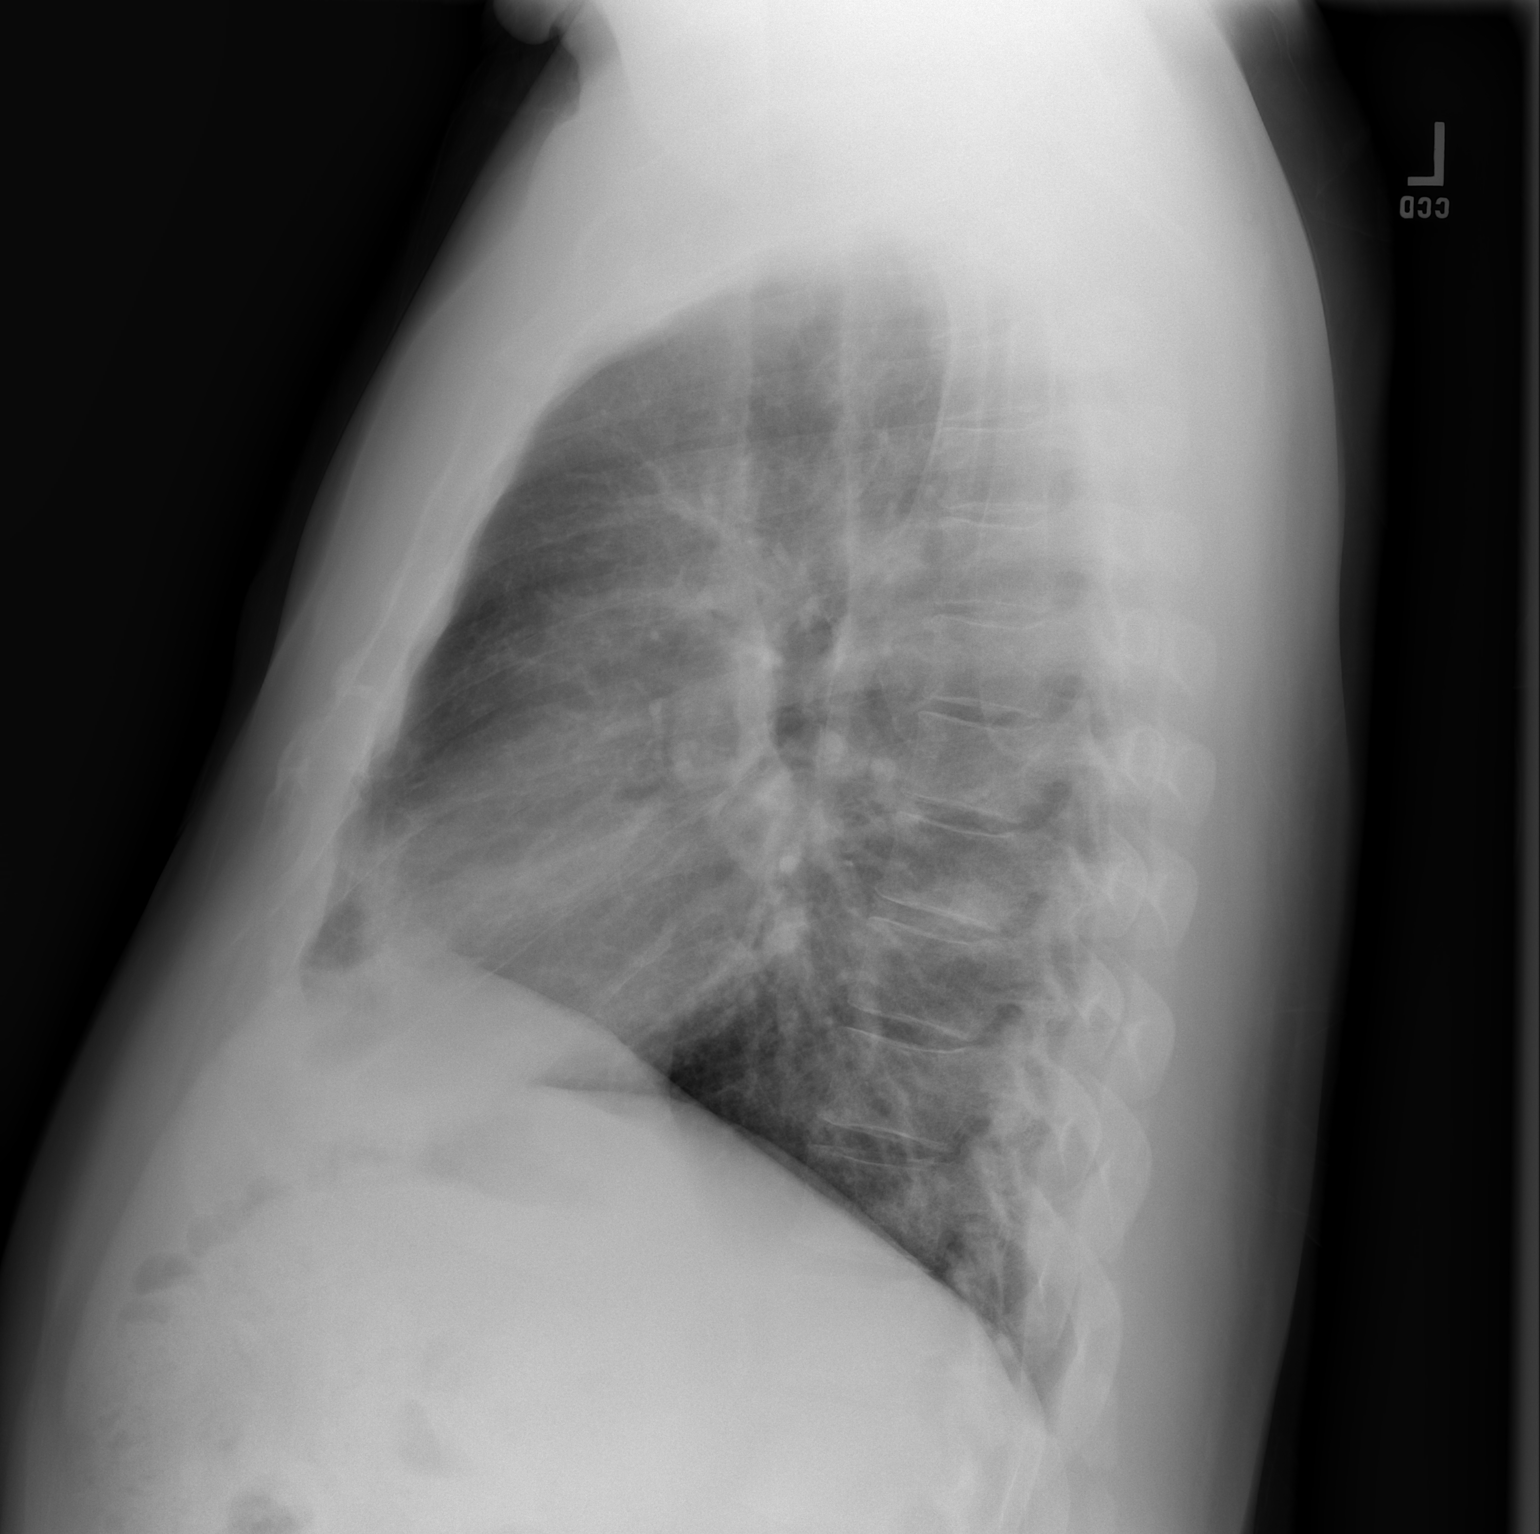

[2 of 2 positions shown; findings below may reference images not displayed]

FINDINGS: Mediastinum hilar structures normal. Heart size normal. Interim
improved aeration both lungs consistent with improving pneumonitis.
Minimal residual interstitial prominence present. No pleural
effusion or pneumothorax.
IMPRESSION: Interim improved aeration both lungs consistent improving
pneumonitis. Minimal residual interstitial prominence present.

## 2020-01-30 ENCOUNTER — Ambulatory Visit: Payer: Self-pay | Admitting: Family Medicine

## 2021-05-11 DEATH — deceased
